# Patient Record
Sex: Female | Born: 1965 | Race: White | Hispanic: No | Smoking: Current every day smoker
Health system: Southern US, Community
[De-identification: ages and names within clinical notes are randomized; demographics above are authoritative.]

## PROBLEM LIST (undated history)

## (undated) DIAGNOSIS — E785 Hyperlipidemia, unspecified: Secondary | ICD-10-CM

## (undated) DIAGNOSIS — F419 Anxiety disorder, unspecified: Secondary | ICD-10-CM

## (undated) DIAGNOSIS — M1990 Unspecified osteoarthritis, unspecified site: Secondary | ICD-10-CM

## (undated) DIAGNOSIS — J439 Emphysema, unspecified: Secondary | ICD-10-CM

## (undated) DIAGNOSIS — C801 Malignant (primary) neoplasm, unspecified: Secondary | ICD-10-CM

## (undated) DIAGNOSIS — F1910 Other psychoactive substance abuse, uncomplicated: Secondary | ICD-10-CM

## (undated) DIAGNOSIS — M797 Fibromyalgia: Secondary | ICD-10-CM

## (undated) DIAGNOSIS — K219 Gastro-esophageal reflux disease without esophagitis: Secondary | ICD-10-CM

## (undated) DIAGNOSIS — D649 Anemia, unspecified: Secondary | ICD-10-CM

## (undated) DIAGNOSIS — I10 Essential (primary) hypertension: Secondary | ICD-10-CM

## (undated) DIAGNOSIS — E079 Disorder of thyroid, unspecified: Secondary | ICD-10-CM

## (undated) DIAGNOSIS — J449 Chronic obstructive pulmonary disease, unspecified: Secondary | ICD-10-CM

## (undated) HISTORY — PX: HAMMER TOE SURGERY: SHX385

## (undated) HISTORY — DX: Disorder of thyroid, unspecified: E07.9

## (undated) HISTORY — DX: Essential (primary) hypertension: I10

## (undated) HISTORY — DX: Unspecified osteoarthritis, unspecified site: M19.90

## (undated) HISTORY — PX: ABDOMINAL HYSTERECTOMY: SHX81

## (undated) HISTORY — PX: SHOULDER SURGERY: SHX246

## (undated) HISTORY — PX: KNEE SURGERY: SHX244

## (undated) HISTORY — PX: APPENDECTOMY: SHX54

## (undated) HISTORY — DX: Other psychoactive substance abuse, uncomplicated: F19.10

## (undated) HISTORY — DX: Hyperlipidemia, unspecified: E78.5

## (undated) HISTORY — PX: CHOLECYSTECTOMY: SHX55

## (undated) HISTORY — DX: Anxiety disorder, unspecified: F41.9

---

## 2009-08-03 ENCOUNTER — Ambulatory Visit: Payer: Self-pay | Admitting: Cardiology

## 2009-08-03 ENCOUNTER — Ambulatory Visit: Payer: Self-pay | Admitting: Cardiovascular Disease

## 2009-08-03 ENCOUNTER — Inpatient Hospital Stay (HOSPITAL_COMMUNITY): Admission: EM | Admit: 2009-08-03 | Discharge: 2009-08-04 | Payer: Self-pay | Admitting: Cardiology

## 2011-02-21 LAB — GLUCOSE, CAPILLARY
Glucose-Capillary: 126 mg/dL — ABNORMAL HIGH (ref 70–99)
Glucose-Capillary: 95 mg/dL (ref 70–99)

## 2011-02-21 LAB — LIPID PANEL
Cholesterol: 185 mg/dL (ref 0–200)
HDL: 59 mg/dL (ref >39)
Total CHOL/HDL Ratio: 3.1 RATIO
Triglycerides: 128 mg/dL (ref <150)

## 2011-02-21 LAB — BASIC METABOLIC PANEL
Calcium: 9 mg/dL (ref 8.4–10.5)
Creatinine, Ser: 0.81 mg/dL (ref 0.4–1.2)
GFR calc Af Amer: >60 mL/min (ref >60)
GFR calc non Af Amer: >60 mL/min (ref >60)
Glucose, Bld: 93 mg/dL (ref 70–99)
Sodium: 128 mEq/L — ABNORMAL LOW (ref 135–145)

## 2011-02-21 LAB — CBC
Hemoglobin: 12.1 g/dL (ref 12.0–15.0)
RDW: 12.9 % (ref 11.5–15.5)

## 2011-07-25 ENCOUNTER — Other Ambulatory Visit: Payer: Self-pay | Admitting: Diagnostic Neuroimaging

## 2011-07-25 DIAGNOSIS — R42 Dizziness and giddiness: Secondary | ICD-10-CM

## 2011-07-25 DIAGNOSIS — R269 Unspecified abnormalities of gait and mobility: Secondary | ICD-10-CM

## 2011-08-04 ENCOUNTER — Other Ambulatory Visit: Payer: Self-pay

## 2011-08-11 ENCOUNTER — Ambulatory Visit
Admission: RE | Admit: 2011-08-11 | Discharge: 2011-08-11 | Disposition: A | Discharge status: Home / Self Care | Payer: Medicaid Other | Source: Ambulatory Visit | Attending: Diagnostic Neuroimaging | Admitting: Diagnostic Neuroimaging

## 2011-08-11 DIAGNOSIS — R42 Dizziness and giddiness: Secondary | ICD-10-CM

## 2011-08-11 DIAGNOSIS — R269 Unspecified abnormalities of gait and mobility: Secondary | ICD-10-CM

## 2014-01-11 ENCOUNTER — Ambulatory Visit: Payer: Self-pay | Admitting: General Practice

## 2014-01-23 ENCOUNTER — Encounter (INDEPENDENT_AMBULATORY_CARE_PROVIDER_SITE_OTHER): Payer: Self-pay

## 2014-01-23 ENCOUNTER — Ambulatory Visit (INDEPENDENT_AMBULATORY_CARE_PROVIDER_SITE_OTHER): Payer: Medicaid Other | Admitting: General Practice

## 2014-01-23 ENCOUNTER — Encounter: Payer: Self-pay | Admitting: General Practice

## 2014-01-23 VITALS — BP 146/91 | HR 69 | Temp 98.2°F | Ht 64.0 in | Wt 182.0 lb

## 2014-01-23 DIAGNOSIS — I10 Essential (primary) hypertension: Secondary | ICD-10-CM

## 2014-01-23 DIAGNOSIS — E039 Hypothyroidism, unspecified: Secondary | ICD-10-CM

## 2014-01-23 DIAGNOSIS — E785 Hyperlipidemia, unspecified: Secondary | ICD-10-CM

## 2014-01-23 MED ORDER — LISINOPRIL 5 MG PO TABS: 5.0000 mg | ORAL_TABLET | Freq: Every day | ORAL | Status: DC

## 2014-01-23 NOTE — Patient Instructions (Addendum)

## 2014-01-23 NOTE — Progress Notes (Signed)
   Subjective:    Patient ID: Kendra Michael, female    DOB: December 31, 1965, 48 y.o.   MRN: [id number]  HPI Patient presents today establish care. She was being seen by [facility], but wants to establish care so acute conditions can be managed. History of HTN, HLD, hypothyroidism. She reports a history of chronic hip/back pain, being managed by [facility]. Anxiety was being managed by [facility] in [place] was managing anxiety up until 4 months ago. Patient stopped going to [facility] due to discharge, not maintaining appointments.     Review of Systems  Constitutional: Negative for fever and chills.  Respiratory: Negative for chest tightness and shortness of breath.   Cardiovascular: Negative for chest pain and palpitations.  Musculoskeletal: Positive for back pain.       Right great toe has surgical pin still in place Chronic back pain        Objective:   Physical Exam  Constitutional: She is oriented to person, place, and time. She appears well-developed and well-nourished.  Cardiovascular: Normal rate, regular rhythm and normal heart sounds.   Pulmonary/Chest: Effort normal and breath sounds normal. No respiratory distress. She exhibits no tenderness.  Abdominal: Soft. Bowel sounds are normal. She exhibits no distension. There is no tenderness.  Musculoskeletal:  Pin noted to right great toe area, negative redness or drainage  Neurological: She is alert and oriented to person, place, and time.  Skin: Skin is warm and dry.  Psychiatric: She has a normal mood and affect.          Assessment & Plan:  1. Hypothyroidism  - Thyroid Panel With TSH  2. Hyperlipidemia  - NMR, lipoprofile  3. Hypertension  - CMP14+EGFR - lisinopril (PRINIVIL,ZESTRIL) 5 MG tablet; Take 1 tablet (5 mg total) by mouth daily.  Dispense: 30 tablet; Refill: 3 -Continue all current medications Labs pending F/u in 3 months Discussed benefits  of regular exercise and healthy eating Patient verbalized understanding Erby Pian, FNP-C

## 2014-01-24 DIAGNOSIS — I10 Essential (primary) hypertension: Secondary | ICD-10-CM | POA: Insufficient documentation

## 2014-01-24 DIAGNOSIS — E039 Hypothyroidism, unspecified: Secondary | ICD-10-CM | POA: Insufficient documentation

## 2014-01-24 DIAGNOSIS — E785 Hyperlipidemia, unspecified: Secondary | ICD-10-CM | POA: Insufficient documentation

## 2014-01-25 LAB — NMR, LIPOPROFILE
CHOLESTEROL: 199 mg/dL (ref <200)
HDL Cholesterol by NMR: 45 mg/dL (ref >=40)
HDL PARTICLE NUMBER: 32.2 umol/L (ref >=30.5)
LDL Particle Number: 1715 nmol/L — ABNORMAL HIGH (ref <1000)
LDL SIZE: 21.7 nm (ref >20.5)
LDLC SERPL CALC-MCNC: 110 mg/dL — ABNORMAL HIGH (ref <100)
LP-IR Score: 64 — ABNORMAL HIGH (ref <=45)
SMALL LDL PARTICLE NUMBER: 637 nmol/L — AB (ref <=527)
TRIGLYCERIDES BY NMR: 222 mg/dL — AB (ref <150)

## 2014-01-25 LAB — CMP14+EGFR
A/G RATIO: 1.8 (ref 1.1–2.5)
ALBUMIN: 4.2 g/dL (ref 3.5–5.5)
ALT: 16 IU/L (ref 0–32)
AST: 20 IU/L (ref 0–40)
Alkaline Phosphatase: 83 IU/L (ref 39–117)
BILIRUBIN TOTAL: 0.2 mg/dL (ref 0.0–1.2)
BUN/Creatinine Ratio: 7 — ABNORMAL LOW (ref 9–23)
BUN: 5 mg/dL — AB (ref 6–24)
CO2: 25 mmol/L (ref 18–29)
Calcium: 9.5 mg/dL (ref 8.7–10.2)
Chloride: 99 mmol/L (ref 97–108)
Creatinine, Ser: 0.72 mg/dL (ref 0.57–1.00)
GFR, EST AFRICAN AMERICAN: 114 mL/min/{1.73_m2} (ref >59)
GFR, EST NON AFRICAN AMERICAN: 99 mL/min/{1.73_m2} (ref >59)
GLOBULIN, TOTAL: 2.3 g/dL (ref 1.5–4.5)
GLUCOSE: 85 mg/dL (ref 65–99)
Potassium: 4.9 mmol/L (ref 3.5–5.2)
Sodium: 141 mmol/L (ref 134–144)
TOTAL PROTEIN: 6.5 g/dL (ref 6.0–8.5)

## 2014-01-25 LAB — THYROID PANEL WITH TSH
Free Thyroxine Index: 2.1 (ref 1.2–4.9)
T3 UPTAKE RATIO: 26 % (ref 24–39)
T4, Total: 8 ug/dL (ref 4.5–12.0)
TSH: 1.82 u[IU]/mL (ref 0.450–4.500)

## 2014-02-03 ENCOUNTER — Other Ambulatory Visit: Payer: Self-pay | Admitting: General Practice

## 2014-02-06 ENCOUNTER — Telehealth: Payer: Self-pay | Admitting: *Deleted

## 2014-02-06 NOTE — Telephone Encounter (Signed)
Aware of results. 

## 2014-02-06 NOTE — Telephone Encounter (Signed)
Message copied by Shelbie Ammons on Mon Feb 06, 2014 12:15 PM ------      Message from: Erby Pian      Created: Fri Feb 03, 2014  6:35 PM       Please inform patient that most labs are wnl. LDL elevated, are you taking pravachol as prescribed. Please work on eating a healthy diet (low fat, baked foods) and some form of regular exercise. Will recheck labs in 3 months. ------

## 2014-02-16 ENCOUNTER — Other Ambulatory Visit: Payer: Self-pay | Admitting: Family Medicine

## 2014-03-06 ENCOUNTER — Other Ambulatory Visit: Payer: Self-pay | Admitting: General Practice

## 2014-03-08 NOTE — Telephone Encounter (Signed)
Last ov 01/23/14. Last refill 10/03/13. Call to [facility] if approved [phone].

## 2014-03-09 ENCOUNTER — Telehealth: Payer: Self-pay | Admitting: *Deleted

## 2014-03-09 ENCOUNTER — Other Ambulatory Visit: Payer: Self-pay | Admitting: General Practice

## 2014-03-09 DIAGNOSIS — F411 Generalized anxiety disorder: Secondary | ICD-10-CM

## 2014-03-09 MED ORDER — CLONAZEPAM 1 MG PO TABS: 1.0000 mg | ORAL_TABLET | Freq: Every day | ORAL | Status: DC | PRN

## 2014-03-09 NOTE — Telephone Encounter (Signed)
Aware, rx for 30 pills of Klonipin 1mg  called to [facility].

## 2014-04-12 ENCOUNTER — Telehealth: Payer: Self-pay | Admitting: General Practice

## 2014-04-17 ENCOUNTER — Telehealth: Payer: Self-pay | Admitting: Family Medicine

## 2014-04-17 ENCOUNTER — Ambulatory Visit: Payer: Medicaid Other | Admitting: Family Medicine

## 2014-04-17 NOTE — Telephone Encounter (Signed)
appt given for tomorrow with bill 

## 2014-04-18 ENCOUNTER — Ambulatory Visit (INDEPENDENT_AMBULATORY_CARE_PROVIDER_SITE_OTHER): Payer: Medicaid Other | Admitting: Family Medicine

## 2014-04-18 ENCOUNTER — Ambulatory Visit (INDEPENDENT_AMBULATORY_CARE_PROVIDER_SITE_OTHER): Payer: Medicaid Other

## 2014-04-18 ENCOUNTER — Encounter: Payer: Self-pay | Admitting: Family Medicine

## 2014-04-18 VITALS — BP 136/81 | HR 93 | Temp 97.8°F | Ht 64.0 in | Wt 188.2 lb

## 2014-04-18 DIAGNOSIS — F17200 Nicotine dependence, unspecified, uncomplicated: Secondary | ICD-10-CM

## 2014-04-18 DIAGNOSIS — Z720 Tobacco use: Secondary | ICD-10-CM

## 2014-04-18 DIAGNOSIS — J439 Emphysema, unspecified: Secondary | ICD-10-CM

## 2014-04-18 DIAGNOSIS — R0602 Shortness of breath: Secondary | ICD-10-CM

## 2014-04-18 DIAGNOSIS — J438 Other emphysema: Secondary | ICD-10-CM

## 2014-04-18 MED ORDER — VARENICLINE TARTRATE 1 MG PO TABS: 1.0000 mg | ORAL_TABLET | Freq: Two times a day (BID) | ORAL | Status: DC

## 2014-04-18 MED ORDER — VARENICLINE TARTRATE 0.5 MG X 11 & 1 MG X 42 PO MISC: ORAL | Status: DC

## 2014-04-18 MED ORDER — BUDESONIDE-FORMOTEROL FUMARATE 160-4.5 MCG/ACT IN AERO: 2.0000 | INHALATION_SPRAY | Freq: Two times a day (BID) | RESPIRATORY_TRACT | Status: DC

## 2014-04-18 NOTE — Progress Notes (Signed)
   Subjective:    Patient ID: Kendra Michael, female    DOB: [birth year], 48 y.o.   MRN: [id number]  HPI This 48 y.o. female presents for evaluation of hospital follow up.  She was recently seen for copd exacerbation. She is feeling a lot better since her hospitalization for copd exacerbation.  She wants to quit smoking.   Review of Systems C/o sob No chest pain,  HA, dizziness, vision change, N/V, diarrhea, constipation, dysuria, urinary urgency or frequency, myalgias, arthralgias or rash.     Objective:   Physical Exam Vital signs noted  Well developed well nourished female.  HEENT - Head atraumatic Normocephalic                Eyes - PERRLA, Conjuctiva - clear Sclera- Clear EOMI                Ears - EAC's Wnl TM's Wnl Gross Hearing WNL                Nose - Nares patent                 Throat - oropharanx wnl Respiratory - Lungs CTA bilateral Cardiac - RRR S1 and S2 without murmur GI - Abdomen soft Nontender and bowel sounds active x 4 Extremities - No edema. Neuro - Grossly intact.  CXR - no infiltrates Prelimnary reading by Iverson Alamin     Assessment & Plan:  COPD (chronic obstructive pulmonary disease) with emphysema - Plan: budesonide-formoterol (SYMBICORT) 160-4.5 MCG/ACT inhaler, DG Chest 2 View  Tobacco abuse - Plan: varenicline (CHANTIX STARTING MONTH PAK) 0.5 MG X 11 & 1 MG X 42 tablet, varenicline (CHANTIX CONTINUING MONTH PAK) 1 MG tablet, DG Chest 2 View  SOB (shortness of breath) - Plan: DG Chest 2 View  Push po fluids, rest, tylenol and motrin otc prn as directed for fever, arthralgias, and myalgias.  Follow up prn if sx's continue or persist.  Lysbeth Penner FNP

## 2014-05-13 ENCOUNTER — Other Ambulatory Visit: Payer: Self-pay | Admitting: General Practice

## 2014-08-01 ENCOUNTER — Other Ambulatory Visit: Payer: Self-pay | Admitting: *Deleted

## 2014-08-01 MED ORDER — LEVOTHYROXINE SODIUM 25 MCG PO TABS: ORAL_TABLET | ORAL | Status: DC

## 2014-08-08 ENCOUNTER — Encounter: Payer: Self-pay | Admitting: Family Medicine

## 2014-08-08 ENCOUNTER — Ambulatory Visit (INDEPENDENT_AMBULATORY_CARE_PROVIDER_SITE_OTHER): Payer: Medicaid Other | Admitting: Family Medicine

## 2014-08-08 VITALS — BP 136/83 | HR 69 | Temp 98.3°F | Ht 64.0 in | Wt 182.0 lb

## 2014-08-08 DIAGNOSIS — I10 Essential (primary) hypertension: Secondary | ICD-10-CM

## 2014-08-08 DIAGNOSIS — F411 Generalized anxiety disorder: Secondary | ICD-10-CM

## 2014-08-08 DIAGNOSIS — E785 Hyperlipidemia, unspecified: Secondary | ICD-10-CM

## 2014-08-08 LAB — POCT CBC
Granulocyte percent: 58.1 %G (ref 37–80)
HCT, POC: 43.9 % (ref 37.7–47.9)
Hemoglobin: 14.5 g/dL (ref 12.2–16.2)
Lymph, poc: 3.8 — AB (ref 0.6–3.4)
MCH, POC: 30.5 pg (ref 27–31.2)
MCHC: 33 g/dL (ref 31.8–35.4)
MCV: 92.5 fL (ref 80–97)
MPV: 8.6 fL (ref 0–99.8)
POC Granulocyte: 5.4 (ref 2–6.9)
POC LYMPH PERCENT: 40.8 %L (ref 10–50)
Platelet Count, POC: 223 10*3/uL (ref 142–424)
RBC: 4.7 M/uL (ref 4.04–5.48)
RDW, POC: 12.3 %
WBC: 9.3 10*3/uL (ref 4.6–10.2)

## 2014-08-08 MED ORDER — PRAVASTATIN SODIUM 40 MG PO TABS: ORAL_TABLET | ORAL | Status: DC

## 2014-08-08 MED ORDER — LEVOTHYROXINE SODIUM 25 MCG PO TABS: ORAL_TABLET | ORAL | Status: DC

## 2014-08-08 MED ORDER — HYDROXYZINE HCL 25 MG PO TABS: 25.0000 mg | ORAL_TABLET | Freq: Three times a day (TID) | ORAL | Status: DC | PRN

## 2014-08-08 MED ORDER — BUPROPION HCL ER (XL) 150 MG PO TB24: 150.0000 mg | ORAL_TABLET | Freq: Every day | ORAL | Status: DC

## 2014-08-08 MED ORDER — LISINOPRIL 5 MG PO TABS: ORAL_TABLET | ORAL | Status: DC

## 2014-08-08 NOTE — Progress Notes (Signed)
   Subjective:    Patient ID: Kendra Michael, female    DOB: Aug 30, 1966, 48 y.o.   MRN: [id number]  HPI  This 48 y.o. female presents for evaluation of follow up on hypertension, hyperlipidemia, and hypothyroidism.  She has hx of tobacco abuse and is trying to quit.  She could not tolerate chantix and stopped taking it.  She is having anxiety and plans on seeing a psychiatrist through [facility].  She is wanting something for anxiety.  Review of Systems C/o anxiety   No chest pain, SOB, HA, dizziness, vision change, N/V, diarrhea, constipation, dysuria, urinary urgency or frequency, myalgias, arthralgias or rash.  Objective:   Physical Exam Vital signs noted  Well developed well nourished female.  HEENT - Head atraumatic Normocephalic                Eyes - PERRLA, Conjuctiva - clear Sclera- Clear EOMI                Ears - EAC's Wnl TM's Wnl Gross Hearing WNL                Nose - Nares patent                 Throat - oropharanx wnl Respiratory - Lungs CTA bilateral Cardiac - RRR S1 and S2 without murmur GI - Abdomen soft Nontender and bowel sounds active x 4 Extremities - No edema. Neuro - Grossly intact.       Assessment & Plan:  Hyperlipemia - Plan: CMP14+EGFR, Lipid panel, pravastatin (PRAVACHOL) 40 MG tablet  Essential hypertension, benign - Plan: POCT CBC, CMP14+EGFR, lisinopril (PRINIVIL,ZESTRIL) 5 MG tablet, levothyroxine (SYNTHROID, LEVOTHROID) 25 MCG tablet  Generalized anxiety disorder - Plan: buPROPion (WELLBUTRIN XL) 150 MG 24 hr tablet, hydrOXYzine (ATARAX/VISTARIL) 25 MG tablet  Tobacco abuse - Wellbutrin, and discussed using nicotine patches.    Lysbeth Penner FNP

## 2014-08-09 ENCOUNTER — Telehealth: Payer: Self-pay | Admitting: Family Medicine

## 2014-08-09 LAB — LIPID PANEL
Chol/HDL Ratio: 5.1 ratio units — ABNORMAL HIGH (ref 0.0–4.4)
Cholesterol, Total: 185 mg/dL (ref 100–199)
HDL: 36 mg/dL — ABNORMAL LOW (ref >39)
LDL Calculated: 87 mg/dL (ref 0–99)
Triglycerides: 308 mg/dL — ABNORMAL HIGH (ref 0–149)
VLDL Cholesterol Cal: 62 mg/dL — ABNORMAL HIGH (ref 5–40)

## 2014-08-09 LAB — CMP14+EGFR
ALT: 20 IU/L (ref 0–32)
AST: 21 IU/L (ref 0–40)
Albumin/Globulin Ratio: 2 (ref 1.1–2.5)
Albumin: 4.3 g/dL (ref 3.5–5.5)
Alkaline Phosphatase: 88 IU/L (ref 39–117)
BUN/Creatinine Ratio: 9 (ref 9–23)
BUN: 8 mg/dL (ref 6–24)
CO2: 25 mmol/L (ref 18–29)
Calcium: 9.6 mg/dL (ref 8.7–10.2)
Chloride: 100 mmol/L (ref 97–108)
Creatinine, Ser: 0.88 mg/dL (ref 0.57–1.00)
GFR calc Af Amer: 89 mL/min/{1.73_m2} (ref >59)
GFR calc non Af Amer: 77 mL/min/{1.73_m2} (ref >59)
Globulin, Total: 2.2 g/dL (ref 1.5–4.5)
Glucose: 106 mg/dL — ABNORMAL HIGH (ref 65–99)
Potassium: 4.4 mmol/L (ref 3.5–5.2)
Sodium: 140 mmol/L (ref 134–144)
Total Bilirubin: <0.2 mg/dL (ref 0.0–1.2)
Total Protein: 6.5 g/dL (ref 6.0–8.5)

## 2014-08-10 ENCOUNTER — Other Ambulatory Visit: Payer: Self-pay | Admitting: Family Medicine

## 2014-08-10 DIAGNOSIS — F411 Generalized anxiety disorder: Secondary | ICD-10-CM

## 2014-08-10 MED ORDER — HYDROXYZINE HCL 25 MG PO TABS: 25.0000 mg | ORAL_TABLET | Freq: Three times a day (TID) | ORAL | Status: DC | PRN

## 2014-08-10 NOTE — Telephone Encounter (Signed)
I sent her #90 w/ 3 rf of atarax and this is all I feel comfortalbe in rx her at this time.  Follow up with [facility]

## 2014-08-10 NOTE — Telephone Encounter (Signed)
Number busy 9/24

## 2014-08-10 NOTE — Telephone Encounter (Signed)
Please send in for number 90

## 2014-08-11 ENCOUNTER — Encounter: Payer: Self-pay | Admitting: *Deleted

## 2014-08-11 ENCOUNTER — Telehealth: Payer: Self-pay | Admitting: *Deleted

## 2014-08-11 NOTE — Telephone Encounter (Signed)
Message copied by Shelbie Ammons on Fri Aug 11, 2014 12:12 PM ------      Message from: Lysbeth Penner      Created: Fri Aug 11, 2014  9:00 AM       Katherene Ponto are elevated and would take fish oil tablets otc ------

## 2014-08-11 NOTE — Telephone Encounter (Signed)
Aware,script was done. He will rrelay message to her.

## 2014-10-19 ENCOUNTER — Other Ambulatory Visit: Payer: Self-pay | Admitting: Family Medicine

## 2014-11-29 ENCOUNTER — Other Ambulatory Visit: Payer: Self-pay | Admitting: Family Medicine

## 2014-12-17 ENCOUNTER — Other Ambulatory Visit: Payer: Self-pay | Admitting: Family Medicine

## 2014-12-21 ENCOUNTER — Ambulatory Visit (INDEPENDENT_AMBULATORY_CARE_PROVIDER_SITE_OTHER): Payer: Medicaid Other | Admitting: Family Medicine

## 2014-12-21 VITALS — BP 164/92 | HR 69 | Temp 98.0°F | Ht 64.0 in | Wt 176.4 lb

## 2014-12-21 DIAGNOSIS — M79674 Pain in right toe(s): Secondary | ICD-10-CM

## 2014-12-21 DIAGNOSIS — F411 Generalized anxiety disorder: Secondary | ICD-10-CM

## 2014-12-21 DIAGNOSIS — E785 Hyperlipidemia, unspecified: Secondary | ICD-10-CM

## 2014-12-21 MED ORDER — CLONAZEPAM 1 MG PO TABS: 1.0000 mg | ORAL_TABLET | Freq: Every day | ORAL | Status: DC | PRN

## 2014-12-21 NOTE — Addendum Note (Signed)
Addended by: Earlene Plater on: 12/21/2014 12:26 PM   Modules accepted: Miquel Dunn

## 2014-12-21 NOTE — Progress Notes (Signed)
   Subjective:    Patient ID: Kendra Michael, female    DOB: 1966-02-28, 49 y.o.   MRN: [id number]  HPI Patient is here with c/o right toe pain.  She has hx of halux valgus and she had this repaired by Dr. Irving Shows podiatrist and he doesn't take medicaid. She needs an ortho referral.  Review of Systems  Constitutional: Negative for fever.  HENT: Negative for ear pain.   Eyes: Negative for discharge.  Respiratory: Negative for cough.   Cardiovascular: Negative for chest pain.  Gastrointestinal: Negative for abdominal distention.  Endocrine: Negative for polyuria.  Genitourinary: Negative for difficulty urinating.  Musculoskeletal: Negative for gait problem and neck pain.  Skin: Negative for color change and rash.  Neurological: Negative for speech difficulty and headaches.  Psychiatric/Behavioral: Negative for agitation.       Objective:    BP 164/92 mmHg  Pulse 69  Temp(Src) 98 F (36.7 C) (Oral)  Ht 5\' 4"  (1.626 m)  Wt 176 lb 6.4 oz (80.015 kg)  BMI 30.26 kg/m2 Physical Exam  Constitutional: She is oriented to person, place, and time. She appears well-developed and well-nourished.  HENT:  Head: Normocephalic and atraumatic.  Mouth/Throat: Oropharynx is clear and moist.  Eyes: Pupils are equal, round, and reactive to light.  Neck: Normal range of motion. Neck supple.  Cardiovascular: Normal rate and regular rhythm.   No murmur heard. Pulmonary/Chest: Effort normal and breath sounds normal.  Abdominal: Soft. Bowel sounds are normal. There is no tenderness.  Neurological: She is alert and oriented to person, place, and time.  Skin: Skin is warm and dry.  Psychiatric: She has a normal mood and affect.          Assessment & Plan:     ICD-9-CM ICD-10-CM   1. Generalized anxiety disorder 300.02 F41.1 Ambulatory referral to Orthopedic Surgery     clonazePAM (KLONOPIN) 1 MG tablet  2. Hyperlipidemia 272.4 E78.5 Ambulatory referral to Orthopedic Surgery     Lipid panel    3. Toe pain, right 729.5 M79.674 Ambulatory referral to Orthopedic Surgery     Ambulatory referral to Orthopedic Surgery     No Follow-up on file.  Lysbeth Penner FNP

## 2014-12-22 ENCOUNTER — Other Ambulatory Visit: Payer: Self-pay | Admitting: Family Medicine

## 2014-12-22 LAB — LIPID PANEL
Chol/HDL Ratio: 6.9 ratio units — ABNORMAL HIGH (ref 0.0–4.4)
Cholesterol, Total: 227 mg/dL — ABNORMAL HIGH (ref 100–199)
HDL: 33 mg/dL — ABNORMAL LOW (ref >39)
LDL Calculated: 125 mg/dL — ABNORMAL HIGH (ref 0–99)
Triglycerides: 344 mg/dL — ABNORMAL HIGH (ref 0–149)
VLDL Cholesterol Cal: 69 mg/dL — ABNORMAL HIGH (ref 5–40)

## 2014-12-22 MED ORDER — PRAVASTATIN SODIUM 80 MG PO TABS: 80.0000 mg | ORAL_TABLET | Freq: Every day | ORAL | Status: DC

## 2015-01-27 ENCOUNTER — Other Ambulatory Visit: Payer: Self-pay | Admitting: Family Medicine

## 2015-02-12 ENCOUNTER — Other Ambulatory Visit: Payer: Self-pay | Admitting: Family Medicine

## 2015-02-17 ENCOUNTER — Emergency Department (HOSPITAL_COMMUNITY): Payer: Medicaid Other

## 2015-02-17 ENCOUNTER — Encounter (HOSPITAL_COMMUNITY): Payer: Self-pay | Admitting: Emergency Medicine

## 2015-02-17 ENCOUNTER — Emergency Department (HOSPITAL_COMMUNITY)
Admission: EM | Admit: 2015-02-17 | Discharge: 2015-02-17 | Disposition: A | Discharge status: Home / Self Care | Payer: Medicaid Other | Attending: Emergency Medicine | Admitting: Emergency Medicine

## 2015-02-17 DIAGNOSIS — W228XXA Striking against or struck by other objects, initial encounter: Secondary | ICD-10-CM | POA: Insufficient documentation

## 2015-02-17 DIAGNOSIS — Y9301 Activity, walking, marching and hiking: Secondary | ICD-10-CM | POA: Diagnosis not present

## 2015-02-17 DIAGNOSIS — S99922A Unspecified injury of left foot, initial encounter: Secondary | ICD-10-CM | POA: Diagnosis present

## 2015-02-17 DIAGNOSIS — E079 Disorder of thyroid, unspecified: Secondary | ICD-10-CM | POA: Diagnosis not present

## 2015-02-17 DIAGNOSIS — Z79899 Other long term (current) drug therapy: Secondary | ICD-10-CM | POA: Insufficient documentation

## 2015-02-17 DIAGNOSIS — I10 Essential (primary) hypertension: Secondary | ICD-10-CM | POA: Diagnosis not present

## 2015-02-17 DIAGNOSIS — Z720 Tobacco use: Secondary | ICD-10-CM | POA: Diagnosis not present

## 2015-02-17 DIAGNOSIS — E785 Hyperlipidemia, unspecified: Secondary | ICD-10-CM | POA: Diagnosis not present

## 2015-02-17 DIAGNOSIS — F419 Anxiety disorder, unspecified: Secondary | ICD-10-CM | POA: Diagnosis not present

## 2015-02-17 DIAGNOSIS — Z791 Long term (current) use of non-steroidal anti-inflammatories (NSAID): Secondary | ICD-10-CM | POA: Diagnosis not present

## 2015-02-17 DIAGNOSIS — Y998 Other external cause status: Secondary | ICD-10-CM | POA: Diagnosis not present

## 2015-02-17 DIAGNOSIS — S90122A Contusion of left lesser toe(s) without damage to nail, initial encounter: Secondary | ICD-10-CM

## 2015-02-17 DIAGNOSIS — M1990 Unspecified osteoarthritis, unspecified site: Secondary | ICD-10-CM | POA: Diagnosis not present

## 2015-02-17 DIAGNOSIS — Z7982 Long term (current) use of aspirin: Secondary | ICD-10-CM | POA: Insufficient documentation

## 2015-02-17 DIAGNOSIS — Y9289 Other specified places as the place of occurrence of the external cause: Secondary | ICD-10-CM | POA: Insufficient documentation

## 2015-02-17 DIAGNOSIS — S90112A Contusion of left great toe without damage to nail, initial encounter: Secondary | ICD-10-CM | POA: Insufficient documentation

## 2015-02-17 MED ORDER — HYDROCODONE-ACETAMINOPHEN 5-325 MG PO TABS
2.0000 | ORAL_TABLET | Freq: Once | ORAL | Status: AC
  Administered 2015-02-17: 2 via ORAL
  Filled 2015-02-17: qty 2

## 2015-02-17 MED ORDER — HYDROCODONE-ACETAMINOPHEN 5-325 MG PO TABS: 1.0000 | ORAL_TABLET | ORAL | Status: DC | PRN

## 2015-02-17 MED ORDER — KETOROLAC TROMETHAMINE 10 MG PO TABS
10.0000 mg | ORAL_TABLET | Freq: Once | ORAL | Status: AC
  Administered 2015-02-17: 10 mg via ORAL
  Filled 2015-02-17: qty 1

## 2015-02-17 MED ORDER — IBUPROFEN 800 MG PO TABS: 800.0000 mg | ORAL_TABLET | Freq: Three times a day (TID) | ORAL | Status: DC

## 2015-02-17 NOTE — ED Notes (Signed)
Patient with no complaints at this time. Respirations even and unlabored. Skin warm/dry. Discharge instructions reviewed with patient at this time. Patient given opportunity to voice concerns/ask questions. Patient discharged at this time and left Emergency Department with steady gait.   

## 2015-02-17 NOTE — Discharge Instructions (Signed)
Your examination is negative for any neurologic or vascular problem. The x-ray of your toe is negative for fracture or dislocation. Please use the buddy tape and the postoperative shoe for the next 3 or 4 days. Please use ibuprofen 3 times daily with food. May use Norco for pain if needed. This medication may cause drowsiness, please do not drive, free machinery, drink alcohol, or participated activity requiring concentration while taking this medication. Please see Dr. Laurance Flatten for additional evaluation and management if not improving.

## 2015-02-17 NOTE — ED Notes (Signed)
Patient reports was walking today and hit left great toe on sidewalk. States has had operation on same toe before. Complaining of increasing pain throughout the evening.

## 2015-02-17 NOTE — ED Provider Notes (Signed)
CSN: [id number]     Arrival date & time 02/17/15  1935 History   First MD Initiated Contact with Patient 02/17/15 2122     Chief Complaint  Patient presents with  . Toe Injury     (Consider location/radiation/quality/duration/timing/severity/associated sxs/prior Treatment) HPI Comments: Patient is a 49 year old female presents to the emergency department with a complaint of left first toe pain.  Patient states that earlier this morning she accidentally hit her first toe against the sidewalk near her home. She states that it hurt at the time of the injury, but as the day has gone by it has been hurting more and more and actually throbbing. She is concerned because she has had an operation on that toe in the past, and is concerned about new injury. She denies being on any anticoagulation medications.  The history is provided by the patient.    Past Medical History  Diagnosis Date  . Hypertension   . Hyperlipidemia   . Thyroid disease   . Substance abuse   . Arthritis   . Anxiety    Past Surgical History  Procedure Laterality Date  . Appendectomy    . Cholecystectomy    . Knee surgery Bilateral   . Shoulder surgery Right   . Hammer toe surgery Bilateral   . Abdominal hysterectomy     History reviewed. No pertinent family history. History  Substance Use Topics  . Smoking status: Current Every Day Smoker -- 1.00 packs/day    Types: Cigarettes  . Smokeless tobacco: Not on file  . Alcohol Use: No   OB History    No data available     Review of Systems  Musculoskeletal: Positive for arthralgias.  Psychiatric/Behavioral: The patient is nervous/anxious.   All other systems reviewed and are negative.     Allergies  Review of patient's allergies indicates no known allergies.  Home Medications   Prior to Admission medications   Medication Sig Start Date End Date Taking? Authorizing Provider  aspirin 81 MG tablet Take 81 mg by mouth daily.    Historical Provider, MD    clonazePAM (KLONOPIN) 1 MG tablet Take 1 tablet (1 mg total) by mouth daily as needed for anxiety. 12/21/14   Lysbeth Penner, FNP  fentaNYL (DURAGESIC - DOSED MCG/HR) 50 MCG/HR Place 50 mcg onto the skin every 3 (three) days.    Historical Provider, MD  levothyroxine (SYNTHROID, LEVOTHROID) 25 MCG tablet TAKE 1 TABLET BY MOUTH EVERY DAY 08/08/14   Lysbeth Penner, FNP  levothyroxine (SYNTHROID, LEVOTHROID) 25 MCG tablet TAKE 1 TABLET BY MOUTH EVERY DAY 01/29/15   Chipper Herb, MD  lisinopril (PRINIVIL,ZESTRIL) 5 MG tablet TAKE 1 TABLET BY MOUTH EVERY DAY 02/13/15   Chipper Herb, MD  meloxicam (MOBIC) 15 MG tablet Take 15 mg by mouth daily.    Historical Provider, MD  Omega-3 Fatty Acids (FISH OIL) 1000 MG CAPS Take 2 capsules by mouth daily.     Historical Provider, MD  oxyCODONE-acetaminophen (PERCOCET) 10-325 MG per tablet Take 1 tablet by mouth 3 (three) times daily.    Historical Provider, MD  pravastatin (PRAVACHOL) 80 MG tablet Take 1 tablet (80 mg total) by mouth daily. 12/22/14   Lysbeth Penner, FNP  SYMBICORT 160-4.5 MCG/ACT inhaler INHALE 2 PUFFS INTO THE LUNGS 2 TIMES DAILY. 10/20/14   Lysbeth Penner, FNP   BP 137/63 mmHg  Pulse 58  Temp(Src) 97.8 F (36.6 C) (Oral)  Resp 17  Ht 5\' 4"  (1.626 m)  Wt 175 lb (79.379 kg)  BMI 30.02 kg/m2  SpO2 98% Physical Exam  Constitutional: She is oriented to person, place, and time. She appears well-developed and well-nourished.  Non-toxic appearance.  HENT:  Head: Normocephalic.  Right Ear: Tympanic membrane and external ear normal.  Left Ear: Tympanic membrane and external ear normal.  Eyes: EOM and lids are normal. Pupils are equal, round, and reactive to light.  Neck: Normal range of motion. Neck supple. Carotid bruit is not present.  Cardiovascular: Normal rate, regular rhythm, normal heart sounds, intact distal pulses and normal pulses.   Pulmonary/Chest: Breath sounds normal. No respiratory distress.  Abdominal: Soft. Bowel  sounds are normal. There is no tenderness. There is no guarding.  Musculoskeletal: Normal range of motion.       Feet:  Pain to palpation and movement of the distal first toe of the left foot. No other injury appreciated of the left foot. The dorsalis pedis pulse is 2+, the posterior tibial pulses 2+. The Achilles tendon is intact. Capillary refill is less than 2 seconds.  Lymphadenopathy:       Head (right side): No submandibular adenopathy present.       Head (left side): No submandibular adenopathy present.    She has no cervical adenopathy.  Neurological: She is alert and oriented to person, place, and time. She has normal strength. No cranial nerve deficit or sensory deficit.  Skin: Skin is warm and dry.  Psychiatric: She has a normal mood and affect. Her speech is normal.  Nursing note and vitals reviewed.   ED Course  Procedures (including critical care time) Labs Review Labs Reviewed - No data to display  Imaging Review No results found.   EKG Interpretation None      MDM  Vital signs are within normal limits. Pulse oximetry is 98% on room air.   X-ray of the left first toe is negative for fracture or dislocation. The patient is fitted with a postoperative shoe, and the first and second toes were buddy taped. The patient is to follow-up with Dr. Gayland Curry for additional evaluation and management if not improving.    Final diagnoses:  None    **I have reviewed nursing notes, vital signs, and all appropriate lab and imaging results for this patient.Lily Kocher, PA-C 02/17/15 [place], MD 02/18/15 [phone]

## 2015-02-18 ENCOUNTER — Telehealth: Payer: Self-pay | Admitting: Surgery

## 2015-02-18 NOTE — Telephone Encounter (Signed)
ED CM received call from [place] [facility] in [place] concerning prescription clarification. [facility] called to inform patient has just received 30 day supply of Percocet 10/325 on March 15 th, so they will not honor prescription for oxycodone/ acetaminophen 5/325mg  she received in the ED.  No further CM needs identified.

## 2015-02-23 ENCOUNTER — Telehealth: Payer: Self-pay | Admitting: Family Medicine

## 2015-02-25 ENCOUNTER — Other Ambulatory Visit: Payer: Self-pay | Admitting: Family Medicine

## 2015-02-27 ENCOUNTER — Telehealth: Payer: Self-pay | Admitting: Family Medicine

## 2015-02-27 NOTE — Telephone Encounter (Signed)
[facility] has appt 03/09/15

## 2015-02-27 NOTE — Telephone Encounter (Signed)
Has appt 03/09/15, has enough thyroid med until then

## 2015-03-09 ENCOUNTER — Encounter: Payer: Self-pay | Admitting: Family

## 2015-03-09 ENCOUNTER — Ambulatory Visit (INDEPENDENT_AMBULATORY_CARE_PROVIDER_SITE_OTHER): Payer: Medicaid Other | Admitting: Family

## 2015-03-09 VITALS — BP 131/72 | HR 68 | Temp 97.9°F | Ht 64.0 in | Wt 171.2 lb

## 2015-03-09 DIAGNOSIS — J441 Chronic obstructive pulmonary disease with (acute) exacerbation: Secondary | ICD-10-CM | POA: Diagnosis not present

## 2015-03-09 DIAGNOSIS — F411 Generalized anxiety disorder: Secondary | ICD-10-CM

## 2015-03-09 DIAGNOSIS — I10 Essential (primary) hypertension: Secondary | ICD-10-CM

## 2015-03-09 DIAGNOSIS — Z1321 Encounter for screening for nutritional disorder: Secondary | ICD-10-CM

## 2015-03-09 DIAGNOSIS — M5136 Other intervertebral disc degeneration, lumbar region: Secondary | ICD-10-CM | POA: Insufficient documentation

## 2015-03-09 DIAGNOSIS — M51369 Other intervertebral disc degeneration, lumbar region without mention of lumbar back pain or lower extremity pain: Secondary | ICD-10-CM

## 2015-03-09 DIAGNOSIS — E785 Hyperlipidemia, unspecified: Secondary | ICD-10-CM | POA: Diagnosis not present

## 2015-03-09 DIAGNOSIS — E039 Hypothyroidism, unspecified: Secondary | ICD-10-CM | POA: Diagnosis not present

## 2015-03-09 DIAGNOSIS — Z1211 Encounter for screening for malignant neoplasm of colon: Secondary | ICD-10-CM

## 2015-03-09 MED ORDER — LEVOTHYROXINE SODIUM 25 MCG PO TABS: 25.0000 ug | ORAL_TABLET | Freq: Every day | ORAL | Status: DC

## 2015-03-09 NOTE — Patient Instructions (Signed)
Hypothyroidism The thyroid is a large gland located in the lower front of your neck. The thyroid gland helps control metabolism. Metabolism is how your body handles food. It controls metabolism with the hormone thyroxine. When this gland is underactive (hypothyroid), it produces too little hormone.  CAUSES These include:   Absence or destruction of thyroid tissue.  Goiter due to iodine deficiency.  Goiter due to medications.  Congenital defects (since birth).  Problems with the pituitary. This causes a lack of TSH (thyroid stimulating hormone). This hormone tells the thyroid to turn out more hormone. SYMPTOMS  Lethargy (feeling as though you have no energy)  Cold intolerance  Weight gain (in spite of normal food intake)  Dry skin  Coarse hair  Menstrual irregularity (if severe, may lead to infertility)  Slowing of thought processes Cardiac problems are also caused by insufficient amounts of thyroid hormone. Hypothyroidism in the newborn is cretinism, and is an extreme form. It is important that this form be treated adequately and immediately or it will lead rapidly to retarded physical and mental development. DIAGNOSIS  To prove hypothyroidism, your caregiver may do blood tests and ultrasound tests. Sometimes the signs are hidden. It may be necessary for your caregiver to watch this illness with blood tests either before or after diagnosis and treatment. TREATMENT  Low levels of thyroid hormone are increased by using synthetic thyroid hormone. This is a safe, effective treatment. It usually takes about four weeks to gain the full effects of the medication. After you have the full effect of the medication, it will generally take another four weeks for problems to leave. Your caregiver may start you on low doses. If you have had heart problems the dose may be gradually increased. It is generally not an emergency to get rapidly to normal. HOME CARE INSTRUCTIONS   Take your  medications as your caregiver suggests. Let your caregiver know of any medications you are taking or start taking. Your caregiver will help you with dosage schedules.  As your condition improves, your dosage needs may increase. It will be necessary to have continuing blood tests as suggested by your caregiver.  Report all suspected medication side effects to your caregiver. SEEK MEDICAL CARE IF: Seek medical care if you develop:  Sweating.  Tremulousness (tremors).  Anxiety.  Rapid weight loss.  Heat intolerance.  Emotional swings.  Diarrhea.  Weakness. SEEK IMMEDIATE MEDICAL CARE IF:  You develop chest pain, an irregular heart beat (palpitations), or a rapid heart beat. MAKE SURE YOU:   Understand these instructions.  Will watch your condition.  Will get help right away if you are not doing well or get worse. Document Released: 11/03/2005 Document Revised: 01/26/2012 Document Reviewed: 06/23/2008 Exit[employer] Patient Information 2015 [employer]. This information is not intended to replace advice given to you by your health care provider. Make sure you discuss any questions you have with your health care provider. Health Maintenance Adopting a healthy lifestyle and getting preventive care can go a long way to promote health and wellness. Talk with your health care provider about what schedule of regular examinations is right for you. This is a good chance for you to check in with your provider about disease prevention and staying healthy. In between checkups, there are plenty of things you can do on your own. Experts have done a lot of research about which lifestyle changes and preventive measures are most likely to keep you healthy. Ask your health care provider for more information. WEIGHT AND   DIET  Eat a healthy diet  Be sure to include plenty of vegetables, fruits, low-fat dairy products, and lean protein.  Do not eat a lot of foods high in solid fats, added sugars, or  salt.  Get regular exercise. This is one of the most important things you can do for your health.  Most adults should exercise for at least 150 minutes each week. The exercise should increase your heart rate and make you sweat (moderate-intensity exercise).  Most adults should also do strengthening exercises at least twice a week. This is in addition to the moderate-intensity exercise.  Maintain a healthy weight  Body mass index (BMI) is a measurement that can be used to identify possible weight problems. It estimates body fat based on height and weight. Your health care provider can help determine your BMI and help you achieve or maintain a healthy weight.  For females 20 years of age and older:   A BMI below 18.5 is considered underweight.  A BMI of 18.5 to 24.9 is normal.  A BMI of 25 to 29.9 is considered overweight.  A BMI of 30 and above is considered obese.  Watch levels of cholesterol and blood lipids  You should start having your blood tested for lipids and cholesterol at 51 years of age, then have this test every 5 years.  You may need to have your cholesterol levels checked more often if:  Your lipid or cholesterol levels are high.  You are older than 50 years of age.  You are at high risk for heart disease.  CANCER SCREENING   Lung Cancer  Lung cancer screening is recommended for adults 55-80 years old who are at high risk for lung cancer because of a history of smoking.  A yearly low-dose CT scan of the lungs is recommended for people who:  Currently smoke.  Have quit within the past 15 years.  Have at least a 30-pack-year history of smoking. A pack year is smoking an average of one pack of cigarettes a day for 1 year.  Yearly screening should continue until it has been 15 years since you quit.  Yearly screening should stop if you develop a health problem that would prevent you from having lung cancer treatment.  Breast Cancer  Practice breast  self-awareness. This means understanding how your breasts normally appear and feel.  It also means doing regular breast self-exams. Let your health care provider know about any changes, no matter how small.  If you are in your 20s or 30s, you should have a clinical breast exam (CBE) by a health care provider every 1-3 years as part of a regular health exam.  If you are 40 or older, have a CBE every year. Also consider having a breast X-ray (mammogram) every year.  If you have a family history of breast cancer, talk to your health care provider about genetic screening.  If you are at high risk for breast cancer, talk to your health care provider about having an MRI and a mammogram every year.  Breast cancer gene (BRCA) assessment is recommended for women who have family members with BRCA-related cancers. BRCA-related cancers include:  Breast.  Ovarian.  Tubal.  Peritoneal cancers.  Results of the assessment will determine the need for genetic counseling and BRCA1 and BRCA2 testing. Cervical Cancer Routine pelvic examinations to screen for cervical cancer are no longer recommended for nonpregnant women who are considered low risk for cancer of the pelvic organs (ovaries, uterus, and vagina)   and who do not have symptoms. A pelvic examination may be necessary if you have symptoms including those associated with pelvic infections. Ask your health care provider if a screening pelvic exam is right for you.   The Pap test is the screening test for cervical cancer for women who are considered at risk.  If you had a hysterectomy for a problem that was not cancer or a condition that could lead to cancer, then you no longer need Pap tests.  If you are older than 65 years, and you have had normal Pap tests for the past 10 years, you no longer need to have Pap tests.  If you have had past treatment for cervical cancer or a condition that could lead to cancer, you need Pap tests and screening for  cancer for at least 20 years after your treatment.  If you no longer get a Pap test, assess your risk factors if they change (such as having a new sexual partner). This can affect whether you should start being screened again.  Some women have medical problems that increase their chance of getting cervical cancer. If this is the case for you, your health care provider may recommend more frequent screening and Pap tests.  The human papillomavirus (HPV) test is another test that may be used for cervical cancer screening. The HPV test looks for the virus that can cause cell changes in the cervix. The cells collected during the Pap test can be tested for HPV.  The HPV test can be used to screen women 30 years of age and older. Getting tested for HPV can extend the interval between normal Pap tests from three to five years.  An HPV test also should be used to screen women of any age who have unclear Pap test results.  After 51 years of age, women should have HPV testing as often as Pap tests.  Colorectal Cancer  This type of cancer can be detected and often prevented.  Routine colorectal cancer screening usually begins at 50 years of age and continues through 51 years of age.  Your health care provider may recommend screening at an earlier age if you have risk factors for colon cancer.  Your health care provider may also recommend using home test kits to check for hidden blood in the stool.  A small camera at the end of a tube can be used to examine your colon directly (sigmoidoscopy or colonoscopy). This is done to check for the earliest forms of colorectal cancer.  Routine screening usually begins at age 50.  Direct examination of the colon should be repeated every 5-10 years through 51 years of age. However, you may need to be screened more often if early forms of precancerous polyps or small growths are found. Skin Cancer  Check your skin from head to toe regularly.  Tell your health  care provider about any new moles or changes in moles, especially if there is a change in a mole's shape or color.  Also tell your health care provider if you have a mole that is larger than the size of a pencil eraser.  Always use sunscreen. Apply sunscreen liberally and repeatedly throughout the day.  Protect yourself by wearing long sleeves, pants, a wide-brimmed hat, and sunglasses whenever you are outside. HEART DISEASE, DIABETES, AND HIGH BLOOD PRESSURE   Have your blood pressure checked at least every 1-2 years. High blood pressure causes heart disease and increases the risk of stroke.  If you are   between 88 years and 67 years old, ask your health care provider if you should take aspirin to prevent strokes.  Have regular diabetes screenings. This involves taking a blood sample to check your fasting blood sugar level.  If you are at a normal weight and have a low risk for diabetes, have this test once every three years after 51 years of age.  If you are overweight and have a high risk for diabetes, consider being tested at a younger age or more often. PREVENTING INFECTION  Hepatitis B  If you have a higher risk for hepatitis B, you should be screened for this virus. You are considered at high risk for hepatitis B if:  You were born in a country where hepatitis B is common. Ask your health care provider which countries are considered high risk.  Your parents were born in a high-risk country, and you have not been immunized against hepatitis B (hepatitis B vaccine).  You have HIV or AIDS.  You use needles to inject street drugs.  You live with someone who has hepatitis B.  You have had sex with someone who has hepatitis B.  You get hemodialysis treatment.  You take certain medicines for conditions, including cancer, organ transplantation, and autoimmune conditions. Hepatitis C  Blood testing is recommended for:  Everyone born from [birth year] through 1967.  Anyone with known  risk factors for hepatitis C. Sexually transmitted infections (STIs)  You should be screened for sexually transmitted infections (STIs) including gonorrhea and chlamydia if:  You are sexually active and are younger than 51 years of age.  You are older than 51 years of age and your health care provider tells you that you are at risk for this type of infection.  Your sexual activity has changed since you were last screened and you are at an increased risk for chlamydia or gonorrhea. Ask your health care provider if you are at risk.  If you do not have HIV, but are at risk, it may be recommended that you take a prescription medicine daily to prevent HIV infection. This is called pre-exposure prophylaxis (PrEP). You are considered at risk if:  You are sexually active and do not regularly use condoms or know the HIV status of your partner(s).  You take drugs by injection.  You are sexually active with a partner who has HIV. Talk with your health care provider about whether you are at high risk of being infected with HIV. If you choose to begin PrEP, you should first be tested for HIV. You should then be tested every 3 months for as long as you are taking PrEP.  PREGNANCY   If you are premenopausal and you may become pregnant, ask your health care provider about preconception counseling.  If you may become pregnant, take 400 to 800 micrograms (mcg) of folic acid every day.  If you want to prevent pregnancy, talk to your health care provider about birth control (contraception). OSTEOPOROSIS AND MENOPAUSE   Osteoporosis is a disease in which the bones lose minerals and strength with aging. This can result in serious bone fractures. Your risk for osteoporosis can be identified using a bone density scan.  If you are 16 years of age or older, or if you are at risk for osteoporosis and fractures, ask your health care provider if you should be screened.  Ask your health care provider whether you  should take a calcium or vitamin D supplement to lower your risk for osteoporosis.  Menopause  may have certain physical symptoms and risks.  Hormone replacement therapy may reduce some of these symptoms and risks. Talk to your health care provider about whether hormone replacement therapy is right for you.  HOME CARE INSTRUCTIONS   Schedule regular health, dental, and eye exams.  Stay current with your immunizations.   Do not use any tobacco products including cigarettes, chewing tobacco, or electronic cigarettes.  If you are pregnant, do not drink alcohol.  If you are breastfeeding, limit how much and how often you drink alcohol.  Limit alcohol intake to no more than 1 drink per day for nonpregnant women. One drink equals 12 ounces of beer, 5 ounces of wine, or 1 ounces of hard liquor.  Do not use street drugs.  Do not share needles.  Ask your health care provider for help if you need support or information about quitting drugs.  Tell your health care provider if you often feel depressed.  Tell your health care provider if you have ever been abused or do not feel safe at home. Document Released: 05/19/2011 Document Revised: 03/20/2014 Document Reviewed: 10/05/2013 Exit[employer] Patient Information 2015 [employer]. This information is not intended to replace advice given to you by your health care provider. Make sure you discuss any questions you have with your health care provider.  

## 2015-03-09 NOTE — Progress Notes (Signed)
Subjective:    Patient ID: Kendra Michael, female    DOB: 1966-10-04, 49 y.o.   MRN: [id number]  Thyroid Problem Presents for follow-up visit. Symptoms include anxiety. Patient reports no depressed mood, diarrhea, dry skin, hair loss or palpitations. The symptoms have been stable. Past treatments include levothyroxine. The treatment provided significant relief. Her past medical history is significant for hyperlipidemia. There is no history of heart failure.  Anxiety Presents for follow-up visit. Symptoms include insomnia, nervous/anxious behavior and shortness of breath (Pt states that is r/t to her COPD). Patient reports no depressed mood, malaise, palpitations or panic. Symptoms occur rarely. The quality of sleep is poor. Nighttime awakenings: several.   Her past medical history is significant for anxiety/panic attacks. There is no history of depression. Past treatments include benzodiazephines.  Hypertension This is a chronic problem. The current episode started more than 1 year ago. The problem has been resolved since onset. The problem is controlled. Associated symptoms include anxiety and shortness of breath (Pt states that is r/t to her COPD). Pertinent negatives include no headaches, palpitations or peripheral edema. Past treatments include ACE inhibitors. The current treatment provides significant improvement. Hypertensive end-organ damage includes a thyroid problem. There is no history of kidney disease, CAD/MI, CVA or heart failure. There is no history of sleep apnea.  Hyperlipidemia This is a chronic problem. The current episode started more than 1 year ago. The problem is uncontrolled. Recent lipid tests were reviewed and are high. Factors aggravating her hyperlipidemia include fatty foods and smoking. Associated symptoms include shortness of breath (Pt states that is r/t to her COPD). Current antihyperlipidemic treatment includes statins. The current treatment provides mild improvement of  lipids. Risk factors for coronary artery disease include dyslipidemia, family history, hypertension, obesity and post-menopausal.  COPD Pt currently taking Symbicort daily.  Pt states this is working well.  Pt told to start taking medication BID.   *Pt states she goes to a pain clinic for her pain medication for DDD and chronic back pain.   Review of Systems  Constitutional: Negative.   HENT: Negative.   Eyes: Negative.   Respiratory: Positive for shortness of breath (Pt states that is r/t to her COPD).   Cardiovascular: Negative.  Negative for palpitations.  Gastrointestinal: Negative.  Negative for diarrhea.  Endocrine: Negative.   Genitourinary: Negative.   Musculoskeletal: Positive for back pain.  Neurological: Negative.  Negative for headaches.  Hematological: Negative.   Psychiatric/Behavioral: The patient is nervous/anxious and has insomnia.   All other systems reviewed and are negative.      Objective:   Physical Exam  Constitutional: She is oriented to person, place, and time. She appears well-developed and well-nourished. No distress.  HENT:  Head: Normocephalic and atraumatic.  Right Ear: External ear normal.  Left Ear: External ear normal.  Nose: Nose normal.  Mouth/Throat: Oropharynx is clear and moist.  Eyes: Pupils are equal, round, and reactive to light.  Neck: Normal range of motion. Neck supple. No thyromegaly present.  Cardiovascular: Normal rate, regular rhythm, normal heart sounds and intact distal pulses.   No murmur heard. Pulmonary/Chest: Effort normal and breath sounds normal. No respiratory distress. She has no wheezes.  Abdominal: Soft. Bowel sounds are normal. She exhibits no distension. There is no tenderness.  Musculoskeletal: Normal range of motion. She exhibits no edema or tenderness.  Neurological: She is alert and oriented to person, place, and time. She has normal reflexes. No cranial nerve deficit.  Skin: Skin is warm and  dry.  Psychiatric:  She has a normal mood and affect. Her behavior is normal. Judgment and thought content normal.  Vitals reviewed.   BP 131/72 mmHg  Pulse 68  Temp(Src) 97.9 F (36.6 C) (Oral)  Ht 5' 4"  (1.626 m)  Wt 171 lb 3.2 oz (77.656 kg)  BMI 29.37 kg/m2       Assessment & Plan:  1. Essential hypertension - CMP14+EGFR  2. Hypothyroidism, unspecified hypothyroidism type - CMP14+EGFR - Thyroid Panel With TSH - levothyroxine (SYNTHROID, LEVOTHROID) 25 MCG tablet; Take 1 tablet (25 mcg total) by mouth daily.  Dispense: 90 tablet; Refill: 1  3. HLD (hyperlipidemia) - CMP14+EGFR  4. COPD exacerbation - CMP14+EGFR  5. GAD (generalized anxiety disorder) - CMP14+EGFR  6. DDD (degenerative disc disease), lumbar - CMP14+EGFR  7. Encounter for vitamin deficiency screening - Vit D  25 hydroxy (rtn osteoporosis monitoring)  8. Encounter for screening colonoscopy - Ambulatory referral to Gastroenterology   Continue all meds Labs pending Health Maintenance reviewed Diet and exercise encouraged RTO 6 months  Evelina Dun, FNP

## 2015-03-10 ENCOUNTER — Other Ambulatory Visit: Payer: Self-pay | Admitting: Family Medicine

## 2015-03-10 LAB — VITAMIN D 25 HYDROXY (VIT D DEFICIENCY, FRACTURES): VIT D 25 HYDROXY: 43.9 ng/mL (ref 30.0–100.0)

## 2015-03-10 LAB — CMP14+EGFR
ALBUMIN: 3.9 g/dL (ref 3.5–5.5)
ALK PHOS: 81 IU/L (ref 39–117)
ALT: 15 IU/L (ref 0–32)
AST: 20 IU/L (ref 0–40)
Albumin/Globulin Ratio: 1.6 (ref 1.1–2.5)
BUN/Creatinine Ratio: 10 (ref 9–23)
BUN: 9 mg/dL (ref 6–24)
Bilirubin Total: 0.3 mg/dL (ref 0.0–1.2)
CO2: 24 mmol/L (ref 18–29)
Calcium: 9.4 mg/dL (ref 8.7–10.2)
Chloride: 99 mmol/L (ref 97–108)
Creatinine, Ser: 0.93 mg/dL (ref 0.57–1.00)
GFR calc Af Amer: 83 mL/min/{1.73_m2} (ref >59)
GFR, EST NON AFRICAN AMERICAN: 72 mL/min/{1.73_m2} (ref >59)
GLOBULIN, TOTAL: 2.4 g/dL (ref 1.5–4.5)
Glucose: 102 mg/dL — ABNORMAL HIGH (ref 65–99)
POTASSIUM: 4.3 mmol/L (ref 3.5–5.2)
SODIUM: 138 mmol/L (ref 134–144)
Total Protein: 6.3 g/dL (ref 6.0–8.5)

## 2015-03-10 LAB — THYROID PANEL WITH TSH
FREE THYROXINE INDEX: 2.9 (ref 1.2–4.9)
T3 Uptake Ratio: 28 % (ref 24–39)
T4, Total: 10.3 ug/dL (ref 4.5–12.0)
TSH: 6.42 u[IU]/mL — ABNORMAL HIGH (ref 0.450–4.500)

## 2015-03-12 ENCOUNTER — Encounter (INDEPENDENT_AMBULATORY_CARE_PROVIDER_SITE_OTHER): Payer: Self-pay | Admitting: *Deleted

## 2015-03-12 ENCOUNTER — Telehealth: Payer: Self-pay | Admitting: *Deleted

## 2015-03-12 ENCOUNTER — Other Ambulatory Visit: Payer: Self-pay | Admitting: Family

## 2015-03-12 IMAGING — CR DG CHEST 2V
2 series · 2 of 2 positions shown · non-contrast
Comparison: Chest radiograph 04/01/2014; chest CT 04/07/2014.

CLINICAL DATA: Followup evaluation.

EXAM:
CHEST  2 VIEW

[view not recorded (1 of 2)]
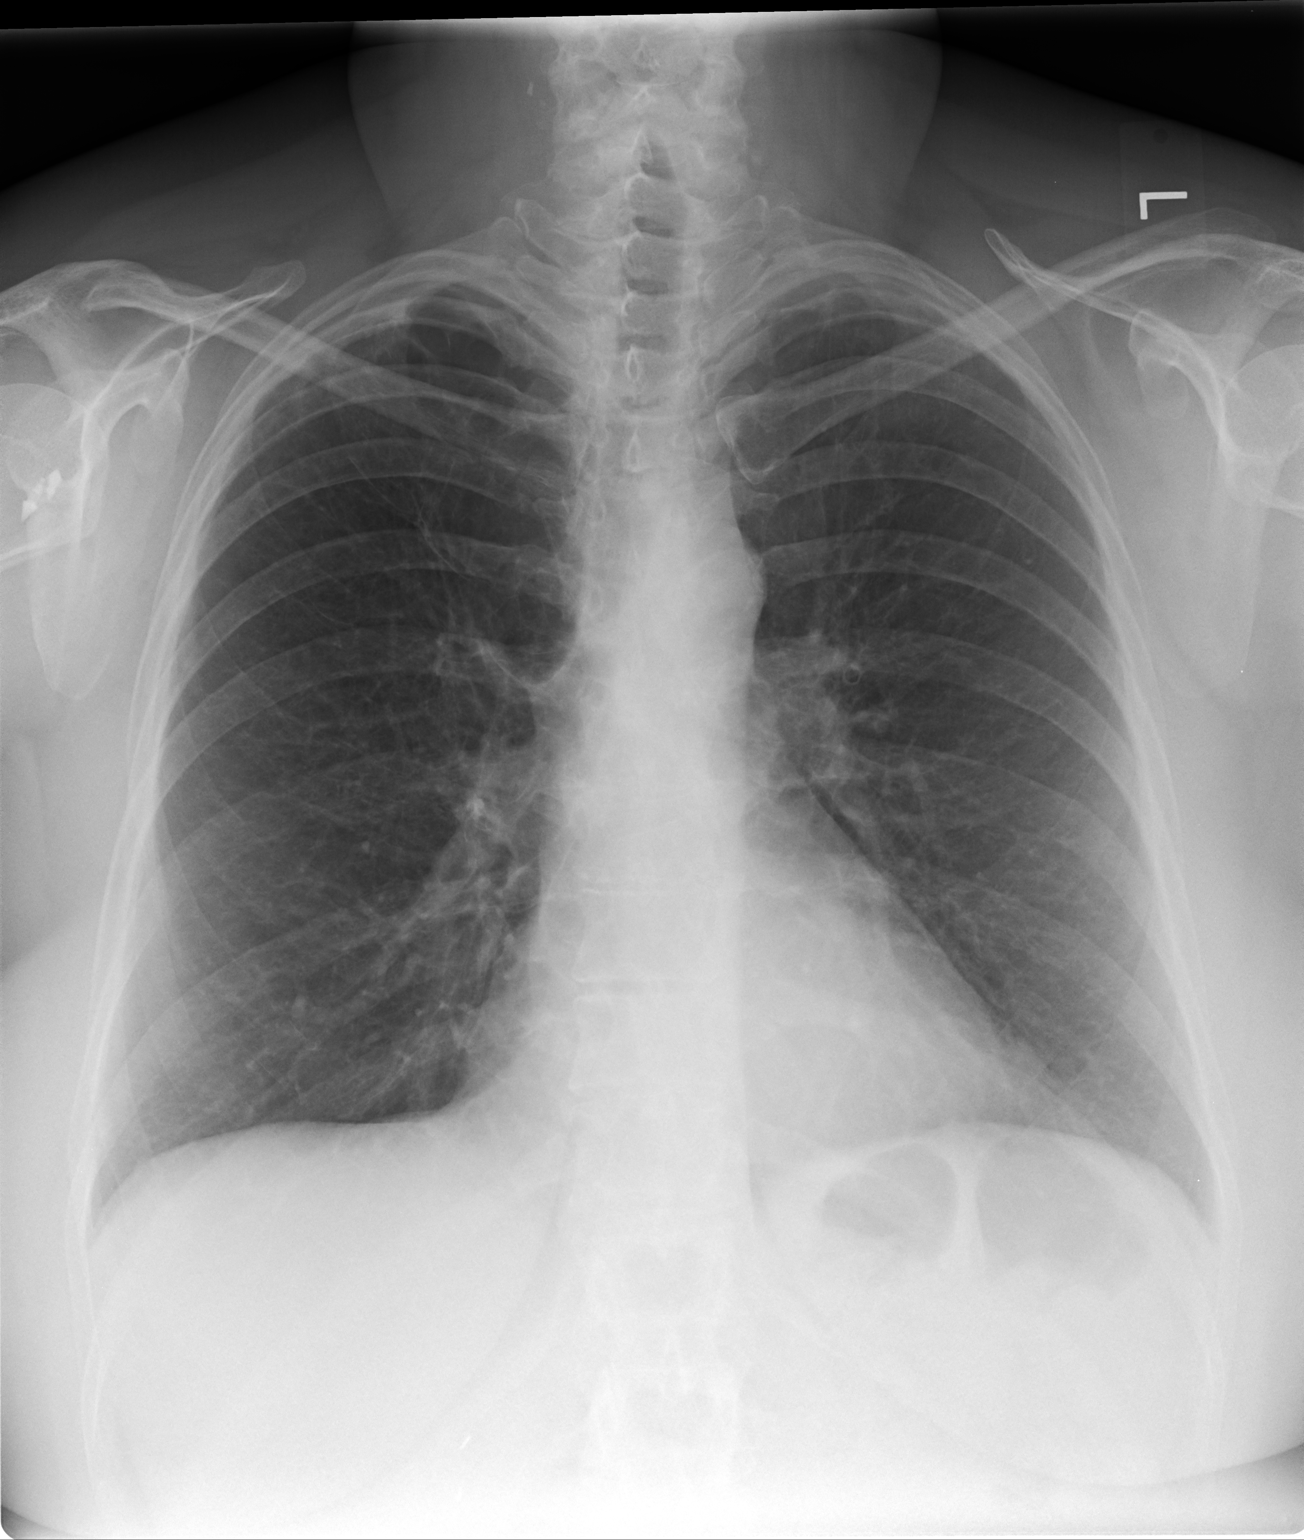

[view not recorded (2 of 2)]
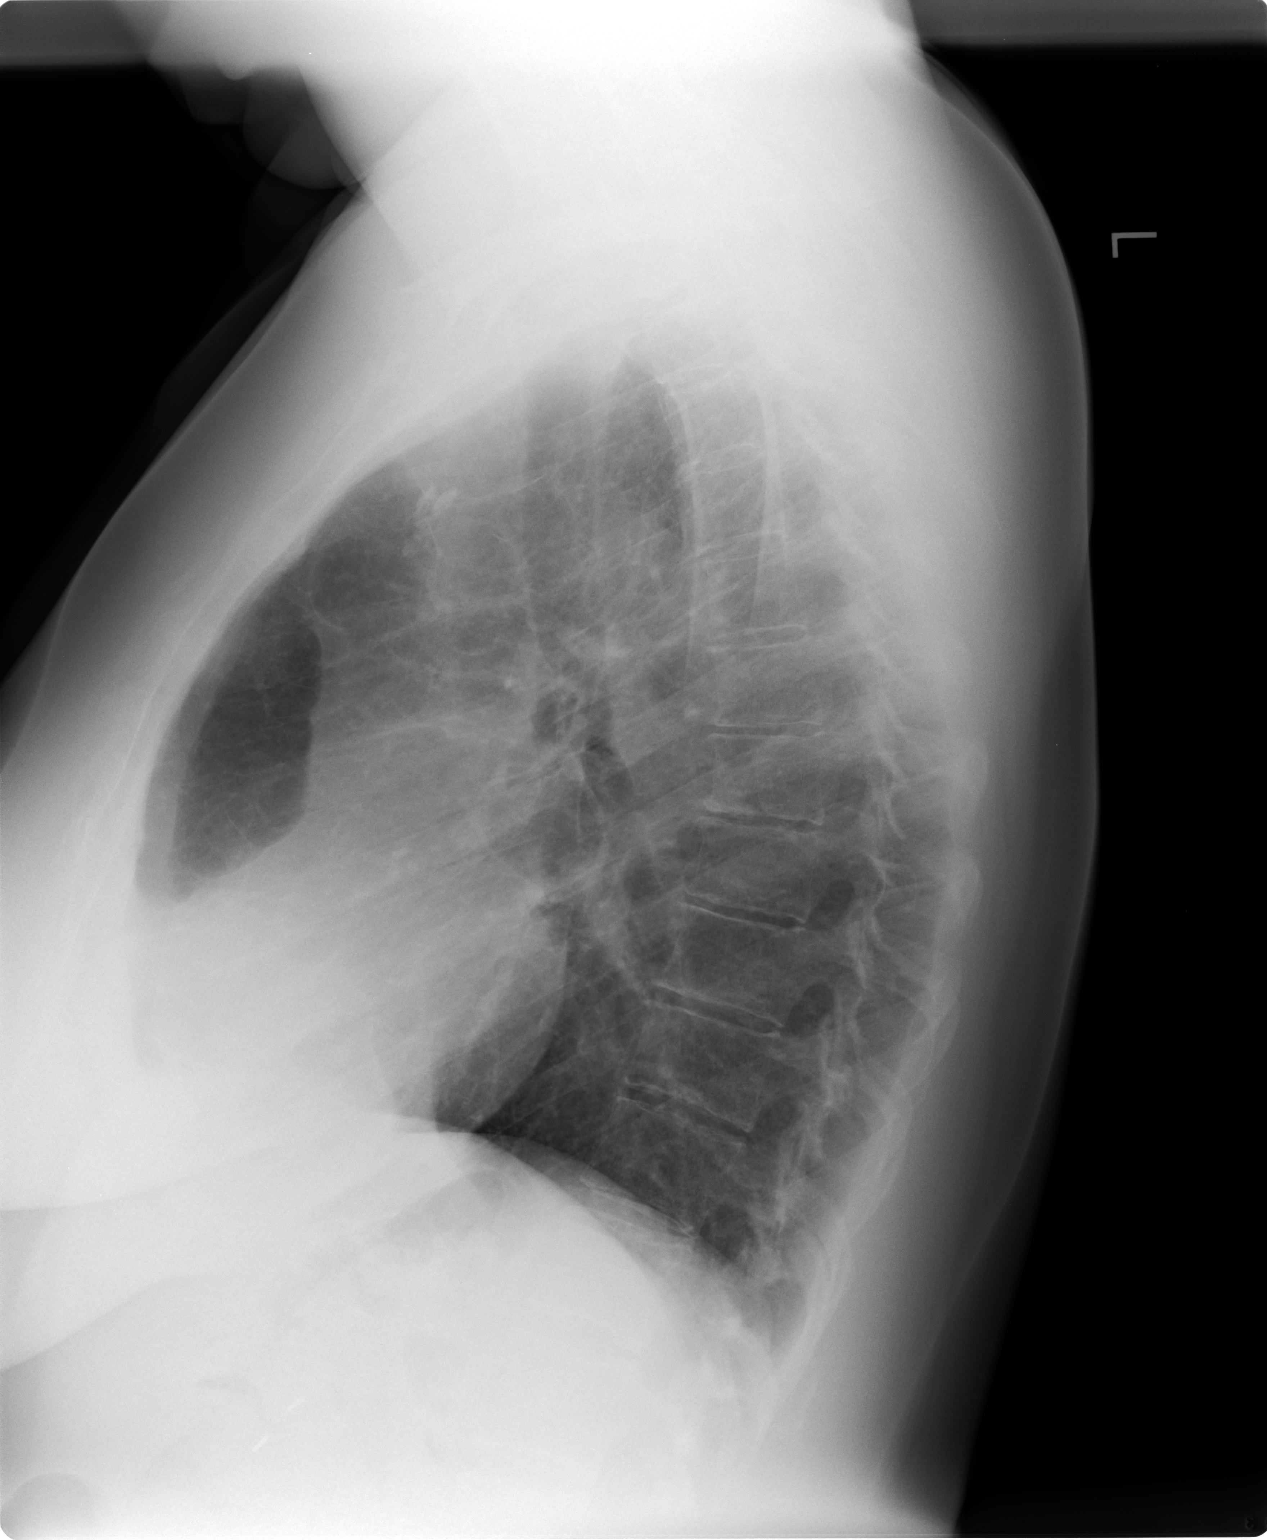

[2 of 2 positions shown; findings below may reference images not displayed]

FINDINGS: Normal cardiac and mediastinal contours. No consolidative pulmonary
opacities. No pleural effusion or pneumothorax. Biapical bullous and
emphysematous change. Postsurgical change proximal right humerus.
IMPRESSION: No active cardiopulmonary disease.

## 2015-03-12 MED ORDER — LEVOTHYROXINE SODIUM 50 MCG PO TABS: 50.0000 ug | ORAL_TABLET | Freq: Every day | ORAL | Status: DC

## 2015-03-12 NOTE — Telephone Encounter (Signed)
-----   Message from Sharion Balloon, [place] sent at 03/12/2015  8:32 AM EDT ----- Kidney and liver function stable Thyroid levels elevated- Levothyroxine increased to 50 mg- Pt will need follow up appt in 8 weeks Vit D levels WNL

## 2015-03-12 NOTE — Telephone Encounter (Signed)
Aware of increase in thyroid medication.

## 2015-03-18 ENCOUNTER — Emergency Department (HOSPITAL_COMMUNITY)
Admission: EM | Admit: 2015-03-18 | Discharge: 2015-03-18 | Disposition: A | Discharge status: Home / Self Care | Payer: Medicaid Other | Attending: Emergency Medicine | Admitting: Emergency Medicine

## 2015-03-18 ENCOUNTER — Encounter (HOSPITAL_COMMUNITY): Payer: Self-pay | Admitting: Emergency Medicine

## 2015-03-18 DIAGNOSIS — Z79899 Other long term (current) drug therapy: Secondary | ICD-10-CM | POA: Diagnosis not present

## 2015-03-18 DIAGNOSIS — M25511 Pain in right shoulder: Secondary | ICD-10-CM

## 2015-03-18 DIAGNOSIS — Z791 Long term (current) use of non-steroidal anti-inflammatories (NSAID): Secondary | ICD-10-CM | POA: Diagnosis not present

## 2015-03-18 DIAGNOSIS — E079 Disorder of thyroid, unspecified: Secondary | ICD-10-CM | POA: Insufficient documentation

## 2015-03-18 DIAGNOSIS — M1990 Unspecified osteoarthritis, unspecified site: Secondary | ICD-10-CM | POA: Diagnosis not present

## 2015-03-18 DIAGNOSIS — Z7982 Long term (current) use of aspirin: Secondary | ICD-10-CM | POA: Diagnosis not present

## 2015-03-18 DIAGNOSIS — E785 Hyperlipidemia, unspecified: Secondary | ICD-10-CM | POA: Insufficient documentation

## 2015-03-18 DIAGNOSIS — F419 Anxiety disorder, unspecified: Secondary | ICD-10-CM | POA: Insufficient documentation

## 2015-03-18 DIAGNOSIS — I10 Essential (primary) hypertension: Secondary | ICD-10-CM | POA: Diagnosis not present

## 2015-03-18 MED ORDER — DICLOFENAC SODIUM 75 MG PO TBEC: 75.0000 mg | DELAYED_RELEASE_TABLET | Freq: Two times a day (BID) | ORAL | Status: DC

## 2015-03-18 NOTE — ED Notes (Signed)
Patient c/o right shoulder pain. Per patient has had rotator cuff repair with a bone spur removed 3 years ago. Patient states she was mopping floor on Thursday and woke Friday unable to move arm due to pain. CNS intact. Radial pulse strong, capillary refill WNL.

## 2015-03-18 NOTE — Discharge Instructions (Signed)
Shoulder Pain  The shoulder is the joint that connects your arm to your body. Muscles and band-like tissues that connect bones to muscles (tendons) hold the joint together. Shoulder pain is felt if an injury or medical problem affects one or more parts of the shoulder.  HOME CARE   · Put ice on the sore area.  ¨ Put ice in a plastic bag.  ¨ Place a towel between your skin and the bag.  ¨ Leave the ice on for 15-20 minutes, 03-04 times a day for the first 2 days.  · Stop using cold packs if they do not help with the pain.  · If you were given something to keep your shoulder from moving (sling; shoulder immobilizer), wear it as told. Only take it off to shower or bathe.  · Move your arm as little as possible, but keep your hand moving to prevent puffiness (swelling).  · Squeeze a soft ball or foam pad as much as possible to help prevent swelling.  · Take medicine as told by your doctor.  GET HELP IF:  · You have progressing new pain in your arm, hand, or fingers.  · Your hand or fingers get cold.  · Your medicine does not help lessen your pain.  GET HELP RIGHT AWAY IF:   · Your arm, hand, or fingers are numb or tingling.  · Your arm, hand, or fingers are puffy (swollen), painful, or turn white or blue.  MAKE SURE YOU:   · Understand these instructions.  · Will watch your condition.  · Will get help right away if you are not doing well or get worse.  Document Released: 04/21/2008 Document Revised: 03/20/2014 Document Reviewed: 05/17/2012  Exit[employer]® Patient Information ©2015 [employer]. This information is not intended to replace advice given to you by your health care provider. Make sure you discuss any questions you have with your health care provider.

## 2015-03-18 NOTE — ED Provider Notes (Signed)
CSN: [id number]     Arrival date & time 03/18/15  1242 History  This chart was scribed for non-physician practitioner Kem Parkinson, PA-C working with Daleen Bo, MD by Zola Button, ED Scribe. This patient was seen in room APFT22/APFT22 and the patient's care was started at 1:43 PM.      Chief Complaint  Patient presents with  . Shoulder Pain   The history is provided by the patient. No language interpreter was used.   HPI Comments: Kendra Michael is a 49 y.o. female with a PSHx of right rotator cuff repair with a bone spur removed 4 years ago who presents to the Emergency Department complaining of gradual onset right shoulder pain radiating into her elbow that started 3 days ago after she was mopping a floor. Her pain progressively worsened the next day. She notes she cannot raise her right arm up, straighten her arm, or lift anything due to the pain. She has tried muscle relaxer and muscle rubs but without relief. Patient denies pain to her neck , swelling, headache and numbness. Her shoulder surgery was done by Dr. Alphonzo Cruise.   Past Medical History  Diagnosis Date  . Hypertension   . Hyperlipidemia   . Thyroid disease   . Substance abuse   . Arthritis   . Anxiety    Past Surgical History  Procedure Laterality Date  . Appendectomy    . Cholecystectomy    . Knee surgery Bilateral   . Shoulder surgery Right   . Hammer toe surgery Bilateral   . Abdominal hysterectomy     Family History  Problem Relation Age of Onset  . Stroke Mother   . Heart attack Father   . Heart attack Brother    History  Substance Use Topics  . Smoking status: Current Every Day Smoker -- 1.00 packs/day for 30 years    Types: Cigarettes  . Smokeless tobacco: Never Used  . Alcohol Use: No   OB History    Gravida Para Term Preterm AB TAB SAB Ectopic Multiple Living   6 4 4  2  2   4      Review of Systems  Constitutional: Negative for fever and chills.  Genitourinary: Negative for dysuria and  difficulty urinating.  Musculoskeletal: Positive for joint swelling and arthralgias. Negative for neck pain.  Skin: Negative for color change and wound.  Neurological: Negative for numbness.  All other systems reviewed and are negative.     Allergies  Review of patient's allergies indicates no known allergies.  Home Medications   Prior to Admission medications   Medication Sig Start Date End Date Taking? Authorizing Provider  aspirin 81 MG tablet Take 81 mg by mouth daily.    Historical Provider, MD  CHANTIX 1 MG tablet Take 1 mg by mouth 2 (two) times daily. 01/11/15   Historical Provider, MD  clonazePAM (KLONOPIN) 1 MG tablet Take 1 tablet (1 mg total) by mouth daily as needed for anxiety. 12/21/14   Lysbeth Penner, FNP  fentaNYL (DURAGESIC - DOSED MCG/HR) 50 MCG/HR Place 50 mcg onto the skin every 3 (three) days.    Historical Provider, MD  levothyroxine (SYNTHROID, LEVOTHROID) 50 MCG tablet Take 1 tablet (50 mcg total) by mouth daily. 03/12/15   Sharion Balloon, FNP  lisinopril (PRINIVIL,ZESTRIL) 5 MG tablet TAKE 1 TABLET BY MOUTH EVERY DAY 02/13/15   Chipper Herb, MD  meloxicam (MOBIC) 15 MG tablet Take 15 mg by mouth daily.    Historical Provider, MD  Omega-3 Fatty Acids (FISH OIL) 1000 MG CAPS Take 2 capsules by mouth daily.     Historical Provider, MD  oxyCODONE-acetaminophen (PERCOCET) 10-325 MG per tablet Take 1 tablet by mouth 3 (three) times daily.    Historical Provider, MD  pravastatin (PRAVACHOL) 40 MG tablet TAKE 1 TABLET BY MOUTH AT BEDTIME 03/12/15   Sharion Balloon, FNP  SYMBICORT 160-4.5 MCG/ACT inhaler INHALE 2 PUFFS INTO THE LUNGS 2 TIMES DAILY. 10/20/14   Lysbeth Penner, FNP   BP 127/79 mmHg  Pulse 72  Temp(Src) 98.2 F (36.8 C) (Oral)  Resp 18  Ht 5\' 4"  (1.626 m)  Wt 168 lb (76.204 kg)  BMI 28.82 kg/m2  SpO2 100% Physical Exam  Constitutional: She is oriented to person, place, and time. She appears well-developed and well-nourished. No distress.  HENT:   Head: Normocephalic and atraumatic.  Mouth/Throat: Oropharynx is clear and moist. No oropharyngeal exudate.  Neck: Normal range of motion. Neck supple.  Cardiovascular: Normal rate and regular rhythm.   Pulmonary/Chest: Effort normal. No respiratory distress.  Musculoskeletal: She exhibits tenderness. She exhibits no edema.  Tenderness to the anterior right shoulder. No erythema or excessive warmth. Pain reproduced with abduction. Radial pulse is brisk, distal sensation and grip strength is strong and equal bilaterally  Neurological: She is alert and oriented to person, place, and time. No cranial nerve deficit.  Skin: Skin is warm and dry. No rash noted. No erythema.  Psychiatric: She has a normal mood and affect. Her behavior is normal.  Nursing note and vitals reviewed.   ED Course  Procedures  DIAGNOSTIC STUDIES: Oxygen Saturation is 100% on room air, normal by my interpretation.    COORDINATION OF CARE: 1:48 PM-Discussed treatment plan which includes follow-up with Dr. Alphonzo Cruise with patient/guardian at bedside and patient/guardian agreed to plan. Recommended patient use ice.   Labs Review Labs Reviewed - No data to display  Imaging Review No results found.   EKG Interpretation None      MDM   Final diagnoses:  Shoulder pain, right  Patient filled prescriptions for Fentanyl 55mcg/hr patch (15) and oxycodone-acetaminophen 10-325 (90) on 02/28/2015.  Pt is well appearing, vitals stable.  Pt understands that additional narcotics or imaging are not indicated at this time and she will need to arrange ortho or pain management f/u.  No concerning sx's for septic joint.  I personally performed the services described in this documentation, which was scribed in my presence. The recorded information has been reviewed and is accurate.    Kem Parkinson, PA-C 03/20/15 1841  Daleen Bo, MD 03/22/15 1409

## 2015-03-19 ENCOUNTER — Telehealth: Payer: Self-pay | Admitting: Family

## 2015-03-19 DIAGNOSIS — M25511 Pain in right shoulder: Secondary | ICD-10-CM

## 2015-03-19 NOTE — Telephone Encounter (Signed)
Patient went to [facility] and they recommend she go back to orthopedic

## 2015-03-19 NOTE — Telephone Encounter (Signed)
Orthopedic referral sent per pt's request

## 2015-04-28 ENCOUNTER — Other Ambulatory Visit: Payer: Self-pay | Admitting: Family Medicine

## 2015-04-30 NOTE — Telephone Encounter (Signed)
Last seen 03/09/15 Kendra Michael  If approved route to nurse to call into [facility]  [phone]

## 2015-04-30 NOTE — Telephone Encounter (Signed)
[facility] 

## 2015-05-02 ENCOUNTER — Other Ambulatory Visit: Payer: Self-pay | Admitting: Family Medicine

## 2015-05-08 ENCOUNTER — Other Ambulatory Visit: Payer: Self-pay

## 2015-05-08 MED ORDER — LISINOPRIL 5 MG PO TABS
5.0000 mg | ORAL_TABLET | Freq: Every day | ORAL | Status: DC
Start: 2015-05-08 — End: 2016-07-03

## 2015-07-24 ENCOUNTER — Encounter: Payer: Self-pay | Admitting: Family

## 2015-07-24 ENCOUNTER — Ambulatory Visit (INDEPENDENT_AMBULATORY_CARE_PROVIDER_SITE_OTHER): Payer: Medicaid Other | Admitting: Family

## 2015-07-24 VITALS — BP 160/80 | HR 59 | Temp 97.7°F | Ht 64.0 in | Wt 160.2 lb

## 2015-07-24 DIAGNOSIS — E039 Hypothyroidism, unspecified: Secondary | ICD-10-CM | POA: Diagnosis not present

## 2015-07-24 DIAGNOSIS — I10 Essential (primary) hypertension: Secondary | ICD-10-CM | POA: Diagnosis not present

## 2015-07-24 DIAGNOSIS — G4700 Insomnia, unspecified: Secondary | ICD-10-CM | POA: Diagnosis not present

## 2015-07-24 DIAGNOSIS — F411 Generalized anxiety disorder: Secondary | ICD-10-CM | POA: Diagnosis not present

## 2015-07-24 MED ORDER — TRAZODONE HCL 50 MG PO TABS: 50.0000 mg | ORAL_TABLET | Freq: Every evening | ORAL | Status: DC | PRN

## 2015-07-24 MED ORDER — ESCITALOPRAM OXALATE 20 MG PO TABS: 20.0000 mg | ORAL_TABLET | Freq: Every day | ORAL | Status: DC

## 2015-07-24 NOTE — Progress Notes (Signed)
Subjective:    Patient ID: Kendra Michael, female    DOB: 1966-08-23, 49 y.o.   MRN: [id number]  Pt presents to the office today to recheck hypothyroidism. Pt's BP is elevated today. PT states she is very stressed and her back is hurting her. Pt states her back is a 5 out 10. Pt see's Pain management every 2 months. Pt is complaining of chronic Insomnia. Pt states she has battled insomnia for 13 years and has trouble staying asleep. Pt states she wakes up every 2 hours. Pt states she has taken Ambien in the past with mild relief. PT states she is using goodies PM with  Mild relief now. Pt is tearful.  Thyroid Problem Presents for follow-up visit. Symptoms include anxiety, depressed mood, fatigue and hoarse voice. Patient reports no nail problem, palpitations, tremors or visual change. The symptoms have been improving. Past treatments include levothyroxine. The treatment provided moderate relief. There is no history of heart failure.  Hypertension This is a chronic problem. The current episode started more than 1 year ago. The problem has been waxing and waning since onset. The problem is uncontrolled. Associated symptoms include anxiety and malaise/fatigue. Pertinent negatives include no headaches, palpitations, peripheral edema or shortness of breath. Risk factors for coronary artery disease include dyslipidemia, obesity, sedentary lifestyle and smoking/tobacco exposure. Past treatments include ACE inhibitors. Hypertensive end-organ damage includes a thyroid problem. There is no history of kidney disease, CAD/MI, CVA or heart failure.  Anxiety Presents for follow-up visit. Symptoms include depressed mood, excessive worry, insomnia and nervous/anxious behavior. Patient reports no palpitations or shortness of breath. Symptoms occur constantly. The severity of symptoms is moderate. The symptoms are aggravated by family issues. The quality of sleep is non-restorative.   Her past medical history is  significant for anxiety/panic attacks and depression.      Review of Systems  Constitutional: Positive for malaise/fatigue and fatigue.  HENT: Positive for hoarse voice.   Eyes: Negative.   Respiratory: Negative.  Negative for shortness of breath.   Cardiovascular: Negative.  Negative for palpitations.  Gastrointestinal: Negative.   Endocrine: Negative.   Genitourinary: Negative.   Musculoskeletal: Negative.   Neurological: Negative.  Negative for tremors and headaches.  Hematological: Negative.   Psychiatric/Behavioral: The patient is nervous/anxious and has insomnia.   All other systems reviewed and are negative.      Objective:   Physical Exam  Constitutional: She is oriented to person, place, and time. She appears well-developed and well-nourished. No distress.  Pt tearful and crying during exam- Pt going through family issues with son at this time  HENT:  Head: Normocephalic and atraumatic.  Eyes: Pupils are equal, round, and reactive to light.  Neck: Normal range of motion. Neck supple. No thyromegaly present.  Cardiovascular: Normal rate, regular rhythm, normal heart sounds and intact distal pulses.   No murmur heard. Pulmonary/Chest: Effort normal and breath sounds normal. No respiratory distress. She has no wheezes.  Abdominal: Soft. Bowel sounds are normal. She exhibits no distension. There is no tenderness.  Musculoskeletal: Normal range of motion. She exhibits no edema or tenderness.  Neurological: She is alert and oriented to person, place, and time. She has normal reflexes. No cranial nerve deficit.  Skin: Skin is warm and dry.  Psychiatric: She has a normal mood and affect. Her behavior is normal. Judgment and thought content normal.  Vitals reviewed.     BP 160/80 mmHg  Pulse 59  Temp(Src) 97.7 F (36.5 C) (Oral)  Ht 5'  4" (1.626 m)  Wt 160 lb 3.2 oz (72.666 kg)  BMI 27.48 kg/m2     Assessment & Plan:  1. Essential hypertension -I believe BP is  elevated today related to GAD and stress-Pt very tearful and emotional today- Last visit pt's BP was WNL - CMP14+EGFR  2. Hypothyroidism, unspecified hypothyroidism type - CMP14+EGFR - Thyroid Panel With TSH  3. Insomnia -Sleep ritual - CMP14+EGFR  4. GAD (generalized anxiety disorder) -Stress management discussed - escitalopram (LEXAPRO) 20 MG tablet; Take 1 tablet (20 mg total) by mouth daily.  Dispense: 90 tablet; Refill: 1 - CMP14+EGFR   Continue all meds Labs pending Health Maintenance reviewed Diet and exercise encouraged RTO 2 months for GAD and Insomnia  Evelina Dun, FNP

## 2015-07-24 NOTE — Addendum Note (Signed)
Addended by: Earlene Plater on: 07/24/2015 03:47 PM   Modules accepted: Miquel Dunn

## 2015-07-24 NOTE — Patient Instructions (Signed)
Generalized Anxiety Disorder Generalized anxiety disorder (GAD) is a mental disorder. It interferes with life functions, including relationships, work, and school. GAD is different from normal anxiety, which everyone experiences at some point in their lives in response to specific life events and activities. Normal anxiety actually helps us prepare for and get through these life events and activities. Normal anxiety goes away after the event or activity is over.  GAD causes anxiety that is not necessarily related to specific events or activities. It also causes excess anxiety in proportion to specific events or activities. The anxiety associated with GAD is also difficult to control. GAD can vary from mild to severe. People with severe GAD can have intense waves of anxiety with physical symptoms (panic attacks).  SYMPTOMS The anxiety and worry associated with GAD are difficult to control. This anxiety and worry are related to many life events and activities and also occur more days than not for 6 months or longer. People with GAD also have three or more of the following symptoms (one or more in children):  Restlessness.   Fatigue.  Difficulty concentrating.   Irritability.  Muscle tension.  Difficulty sleeping or unsatisfying sleep. DIAGNOSIS GAD is diagnosed through an assessment by your health care provider. Your health care provider will ask you questions aboutyour mood,physical symptoms, and events in your life. Your health care provider may ask you about your medical history and use of alcohol or drugs, including prescription medicines. Your health care provider may also do a physical exam and blood tests. Certain medical conditions and the use of certain substances can cause symptoms similar to those associated with GAD. Your health care provider may refer you to a mental health specialist for further evaluation. TREATMENT The following therapies are usually used to treat GAD:    Medication. Antidepressant medication usually is prescribed for long-term daily control. Antianxiety medicines may be added in severe cases, especially when panic attacks occur.   Talk therapy (psychotherapy). Certain types of talk therapy can be helpful in treating GAD by providing support, education, and guidance. A form of talk therapy called cognitive behavioral therapy can teach you healthy ways to think about and react to daily life events and activities.  Stress managementtechniques. These include yoga, meditation, and exercise and can be very helpful when they are practiced regularly. A mental health specialist can help determine which treatment is best for you. Some people see improvement with one therapy. However, other people require a combination of therapies. Document Released: 02/28/2013 Document Revised: 03/20/2014 Document Reviewed: 02/28/2013 Exit[employer] Patient Information 2015 [employer]. This information is not intended to replace advice given to you by your health care provider. Make sure you discuss any questions you have with your health care provider.  

## 2015-07-25 LAB — CMP14+EGFR
ALBUMIN: 4 g/dL (ref 3.5–5.5)
ALK PHOS: 73 IU/L (ref 39–117)
ALT: 14 IU/L (ref 0–32)
AST: 17 IU/L (ref 0–40)
Albumin/Globulin Ratio: 1.7 (ref 1.1–2.5)
BILIRUBIN TOTAL: 0.2 mg/dL (ref 0.0–1.2)
BUN / CREAT RATIO: 6 — AB (ref 9–23)
BUN: 4 mg/dL — AB (ref 6–24)
CO2: 26 mmol/L (ref 18–29)
Calcium: 9.6 mg/dL (ref 8.7–10.2)
Chloride: 100 mmol/L (ref 97–108)
Creatinine, Ser: 0.68 mg/dL (ref 0.57–1.00)
GFR calc non Af Amer: 102 mL/min/{1.73_m2} (ref >59)
GFR, EST AFRICAN AMERICAN: 117 mL/min/{1.73_m2} (ref >59)
GLOBULIN, TOTAL: 2.3 g/dL (ref 1.5–4.5)
GLUCOSE: 81 mg/dL (ref 65–99)
Potassium: 4.3 mmol/L (ref 3.5–5.2)
SODIUM: 140 mmol/L (ref 134–144)
TOTAL PROTEIN: 6.3 g/dL (ref 6.0–8.5)

## 2015-07-25 LAB — THYROID PANEL WITH TSH
FREE THYROXINE INDEX: 2.6 (ref 1.2–4.9)
T3 Uptake Ratio: 25 % (ref 24–39)
T4, Total: 10.2 ug/dL (ref 4.5–12.0)
TSH: 1.26 u[IU]/mL (ref 0.450–4.500)

## 2015-08-07 ENCOUNTER — Telehealth: Payer: Self-pay | Admitting: Family Medicine

## 2015-08-07 DIAGNOSIS — Z1211 Encounter for screening for malignant neoplasm of colon: Secondary | ICD-10-CM

## 2015-08-07 NOTE — Telephone Encounter (Signed)
Referral sent 

## 2015-08-09 ENCOUNTER — Other Ambulatory Visit: Payer: Self-pay | Admitting: Family

## 2015-08-10 ENCOUNTER — Telehealth: Payer: Self-pay | Admitting: Family Medicine

## 2015-08-14 NOTE — Telephone Encounter (Signed)
LMOM as per DPR  Of 02/2015 with appointment date/time,and stated she should be receiving letter in mail with all information

## 2015-09-19 ENCOUNTER — Telehealth: Payer: Self-pay | Admitting: Family Medicine

## 2015-09-22 ENCOUNTER — Other Ambulatory Visit: Payer: Self-pay | Admitting: Family

## 2015-09-27 ENCOUNTER — Other Ambulatory Visit: Payer: Self-pay | Admitting: *Deleted

## 2015-09-27 MED ORDER — ESCITALOPRAM OXALATE 20 MG PO TABS: 20.0000 mg | ORAL_TABLET | Freq: Every day | ORAL | Status: DC

## 2015-10-15 ENCOUNTER — Other Ambulatory Visit: Payer: Self-pay | Admitting: Family

## 2015-10-15 NOTE — Telephone Encounter (Signed)
Last seen 07/24/15  Kendra Michael   If approved route to [facility]  [phone]

## 2015-10-15 NOTE — Telephone Encounter (Signed)
rx called into pharmacy

## 2015-10-16 ENCOUNTER — Other Ambulatory Visit: Payer: Self-pay

## 2015-10-16 MED ORDER — VARENICLINE TARTRATE 1 MG PO TABS: 1.0000 mg | ORAL_TABLET | Freq: Two times a day (BID) | ORAL | Status: DC

## 2015-10-16 NOTE — Telephone Encounter (Signed)
Last seen 07/24/15  Kendra Michael  This med not on [facility]

## 2015-10-29 ENCOUNTER — Telehealth: Payer: Self-pay | Admitting: Family Medicine

## 2015-10-29 NOTE — Telephone Encounter (Signed)
Please discuss this patient with me during the day on Tuesday as I am not familiar with her other than with telephone visits. She may need to make an appointment to come in and see a provider so that the decision to stop the aspirin can be made appropriately

## 2015-10-30 NOTE — Telephone Encounter (Signed)
Pt can stop aspirin for 7 days for procedure. Please fax note. Thanks

## 2015-11-07 NOTE — Telephone Encounter (Signed)
Letter faxed.

## 2015-11-28 ENCOUNTER — Other Ambulatory Visit: Payer: Self-pay | Admitting: Family

## 2015-11-28 NOTE — Telephone Encounter (Signed)
Last filled 10/24/15, last seen 07/24/15

## 2015-12-03 ENCOUNTER — Other Ambulatory Visit: Payer: Self-pay | Admitting: Family

## 2015-12-04 ENCOUNTER — Other Ambulatory Visit: Payer: Self-pay | Admitting: *Deleted

## 2015-12-04 MED ORDER — PRAVASTATIN SODIUM 80 MG PO TABS: 80.0000 mg | ORAL_TABLET | Freq: Every day | ORAL | Status: DC

## 2015-12-04 NOTE — Telephone Encounter (Signed)
Last seen 07/24/15  Kendra Michael   

## 2015-12-05 NOTE — Telephone Encounter (Signed)
Please forward to Christy 

## 2015-12-28 ENCOUNTER — Other Ambulatory Visit: Payer: Self-pay | Admitting: Family

## 2015-12-29 ENCOUNTER — Other Ambulatory Visit: Payer: Self-pay | Admitting: Family

## 2015-12-31 NOTE — Telephone Encounter (Signed)
Last sen 07/24/15  Christy  If approved route to nurse [facility]  [phone]

## 2016-01-01 NOTE — Telephone Encounter (Signed)
Left message on [facility]

## 2016-01-05 ENCOUNTER — Other Ambulatory Visit: Payer: Self-pay | Admitting: Family

## 2016-01-07 NOTE — Telephone Encounter (Signed)
Last filled 11/29/15, last seen 07/24/15

## 2016-01-29 ENCOUNTER — Other Ambulatory Visit: Payer: Self-pay | Admitting: Family

## 2016-03-08 ENCOUNTER — Other Ambulatory Visit: Payer: Self-pay | Admitting: Family

## 2016-03-10 NOTE — Telephone Encounter (Signed)
Last seen 07/24/15  Kendra Michael   

## 2016-03-15 ENCOUNTER — Other Ambulatory Visit: Payer: Self-pay | Admitting: Family

## 2016-03-17 NOTE — Telephone Encounter (Signed)
Last seen 07/24/15  Kendra Michael  Requesting 90- day supply

## 2016-03-27 ENCOUNTER — Other Ambulatory Visit: Payer: Self-pay

## 2016-03-27 MED ORDER — BUDESONIDE-FORMOTEROL FUMARATE 160-4.5 MCG/ACT IN AERO: INHALATION_SPRAY | RESPIRATORY_TRACT | Status: DC

## 2016-04-01 ENCOUNTER — Telehealth: Payer: Self-pay

## 2016-04-01 NOTE — Telephone Encounter (Signed)
Insurance prior authorized Symbicort x 2 years  [id number]

## 2016-04-07 ENCOUNTER — Other Ambulatory Visit: Payer: Self-pay | Admitting: Family

## 2016-04-08 ENCOUNTER — Telehealth: Payer: Self-pay

## 2016-04-08 NOTE — Telephone Encounter (Signed)
Medicaid prior authorized Symbicort  [phone]

## 2016-04-11 ENCOUNTER — Other Ambulatory Visit: Payer: Self-pay | Admitting: Family

## 2016-04-15 NOTE — Telephone Encounter (Signed)
Last seen 07/24/15 Kendra Michael

## 2016-05-03 ENCOUNTER — Other Ambulatory Visit: Payer: Self-pay | Admitting: Family

## 2016-05-05 NOTE — Telephone Encounter (Signed)
Last seen 07/24/15 Kendra Michael

## 2016-05-14 ENCOUNTER — Other Ambulatory Visit: Payer: Self-pay | Admitting: Family

## 2016-05-23 ENCOUNTER — Other Ambulatory Visit: Payer: Self-pay | Admitting: Family

## 2016-06-04 ENCOUNTER — Other Ambulatory Visit: Payer: Self-pay | Admitting: Family

## 2016-06-04 NOTE — Telephone Encounter (Signed)
Patient aware of refill and has appointment for tomorrow.

## 2016-06-04 NOTE — Telephone Encounter (Signed)
Evette Doffing coverage [place] last seen 07/2015  Nurse Can phone in IF approved

## 2016-06-05 ENCOUNTER — Ambulatory Visit (INDEPENDENT_AMBULATORY_CARE_PROVIDER_SITE_OTHER): Payer: Medicaid Other | Admitting: Family

## 2016-06-05 ENCOUNTER — Other Ambulatory Visit: Payer: Self-pay | Admitting: Family

## 2016-06-05 ENCOUNTER — Encounter (INDEPENDENT_AMBULATORY_CARE_PROVIDER_SITE_OTHER): Payer: Self-pay | Admitting: *Deleted

## 2016-06-05 ENCOUNTER — Encounter: Payer: Self-pay | Admitting: Family

## 2016-06-05 VITALS — BP 140/80 | HR 62 | Temp 98.0°F | Ht 64.0 in | Wt 139.0 lb

## 2016-06-05 DIAGNOSIS — E785 Hyperlipidemia, unspecified: Secondary | ICD-10-CM

## 2016-06-05 DIAGNOSIS — G4700 Insomnia, unspecified: Secondary | ICD-10-CM

## 2016-06-05 DIAGNOSIS — M5136 Other intervertebral disc degeneration, lumbar region: Secondary | ICD-10-CM

## 2016-06-05 DIAGNOSIS — F32A Depression, unspecified: Secondary | ICD-10-CM

## 2016-06-05 DIAGNOSIS — Z1211 Encounter for screening for malignant neoplasm of colon: Secondary | ICD-10-CM

## 2016-06-05 DIAGNOSIS — F411 Generalized anxiety disorder: Secondary | ICD-10-CM

## 2016-06-05 DIAGNOSIS — I10 Essential (primary) hypertension: Secondary | ICD-10-CM

## 2016-06-05 DIAGNOSIS — F329 Major depressive disorder, single episode, unspecified: Secondary | ICD-10-CM

## 2016-06-05 DIAGNOSIS — Z1239 Encounter for other screening for malignant neoplasm of breast: Secondary | ICD-10-CM

## 2016-06-05 DIAGNOSIS — Z0000 Encounter for general adult medical examination without abnormal findings: Secondary | ICD-10-CM

## 2016-06-05 DIAGNOSIS — E039 Hypothyroidism, unspecified: Secondary | ICD-10-CM

## 2016-06-05 DIAGNOSIS — J441 Chronic obstructive pulmonary disease with (acute) exacerbation: Secondary | ICD-10-CM

## 2016-06-05 DIAGNOSIS — F17200 Nicotine dependence, unspecified, uncomplicated: Secondary | ICD-10-CM | POA: Insufficient documentation

## 2016-06-05 DIAGNOSIS — M51369 Other intervertebral disc degeneration, lumbar region without mention of lumbar back pain or lower extremity pain: Secondary | ICD-10-CM

## 2016-06-05 DIAGNOSIS — Z01419 Encounter for gynecological examination (general) (routine) without abnormal findings: Secondary | ICD-10-CM

## 2016-06-05 DIAGNOSIS — K5900 Constipation, unspecified: Secondary | ICD-10-CM | POA: Insufficient documentation

## 2016-06-05 LAB — URINALYSIS, COMPLETE
BILIRUBIN UA: NEGATIVE
Glucose, UA: NEGATIVE
Ketones, UA: NEGATIVE
NITRITE UA: NEGATIVE
Protein, UA: NEGATIVE
RBC UA: NEGATIVE
SPEC GRAV UA: 1.025 (ref 1.005–1.030)
Urobilinogen, Ur: 0.2 mg/dL (ref 0.2–1.0)
pH, UA: 5.5 (ref 5.0–7.5)

## 2016-06-05 LAB — MICROSCOPIC EXAMINATION: RBC MICROSCOPIC, UA: NONE SEEN /HPF (ref 0–?)

## 2016-06-05 MED ORDER — VARENICLINE TARTRATE 1 MG PO TABS: 1.0000 mg | ORAL_TABLET | Freq: Two times a day (BID) | ORAL | Status: DC

## 2016-06-05 MED ORDER — LINACLOTIDE 72 MCG PO CAPS: 72.0000 ug | ORAL_CAPSULE | Freq: Every day | ORAL | Status: DC

## 2016-06-05 MED ORDER — VORTIOXETINE HBR 20 MG PO TABS: 20.0000 mg | ORAL_TABLET | Freq: Every day | ORAL | Status: DC

## 2016-06-05 MED ORDER — VARENICLINE TARTRATE 0.5 MG X 11 & 1 MG X 42 PO MISC: ORAL | Status: DC

## 2016-06-05 NOTE — Patient Instructions (Signed)
Major Depressive Disorder Major depressive disorder is a mental illness. It also may be called clinical depression or unipolar depression. Major depressive disorder usually causes feelings of sadness, hopelessness, or helplessness. Some people with this disorder do not feel particularly sad but lose interest in doing things they used to enjoy (anhedonia). Major depressive disorder also can cause physical symptoms. It can interfere with work, school, relationships, and other normal everyday activities. The disorder varies in severity but is longer lasting and more serious than the sadness we all feel from time to time in our lives. Major depressive disorder often is triggered by stressful life events or major life changes. Examples of these triggers include divorce, loss of your job or home, a move, and the death of a family member or close friend. Sometimes this disorder occurs for no obvious reason at all. People who have family members with major depressive disorder or bipolar disorder are at higher risk for developing this disorder, with or without life stressors. Major depressive disorder can occur at any age. It may occur just once in your life (single episode major depressive disorder). It may occur multiple times (recurrent major depressive disorder). SYMPTOMS People with major depressive disorder have either anhedonia or depressed mood on nearly a daily basis for at least 2 weeks or longer. Symptoms of depressed mood include:  Feelings of sadness (blue or down in the dumps) or emptiness.  Feelings of hopelessness or helplessness.  Tearfulness or episodes of crying (may be observed by others).  Irritability (children and adolescents). In addition to depressed mood or anhedonia or both, people with this disorder have at least four of the following symptoms:  Difficulty sleeping or sleeping too much.   Significant change (increase or decrease) in appetite or weight.   Lack of energy or  motivation.  Feelings of guilt and worthlessness.   Difficulty concentrating, remembering, or making decisions.  Unusually slow movement (psychomotor retardation) or restlessness (as observed by others).   Recurrent wishes for death, recurrent thoughts of self-harm (suicide), or a suicide attempt. People with major depressive disorder commonly have persistent negative thoughts about themselves, other people, and the world. People with severe major depressive disorder may experiencedistorted beliefs or perceptions about the world (psychotic delusions). They also may see or hear things that are not real (psychotic hallucinations). DIAGNOSIS Major depressive disorder is diagnosed through an assessment by your health care provider. Your health care provider will ask aboutaspects of your daily life, such as mood,sleep, and appetite, to see if you have the diagnostic symptoms of major depressive disorder. Your health care provider may ask about your medical history and use of alcohol or drugs, including prescription medicines. Your health care provider also may do a physical exam and blood work. This is because certain medical conditions and the use of certain substances can cause major depressive disorder-like symptoms (secondary depression). Your health care provider also may refer you to a mental health specialist for further evaluation and treatment. TREATMENT It is important to recognize the symptoms of major depressive disorder and seek treatment. The following treatments can be prescribed for this disorder:   Medicine. Antidepressant medicines usually are prescribed. Antidepressant medicines are thought to correct chemical imbalances in the brain that are commonly associated with major depressive disorder. Other types of medicine may be added if the symptoms do not respond to antidepressant medicines alone or if psychotic delusions or hallucinations occur.  Talk therapy. Talk therapy can be  helpful in treating major depressive disorder by providing   support, education, and guidance. Certain types of talk therapy also can help with negative thinking (cognitive behavioral therapy) and with relationship issues that trigger this disorder (interpersonal therapy). A mental health specialist can help determine which treatment is best for you. Most people with major depressive disorder do well with a combination of medicine and talk therapy. Treatments involving electrical stimulation of the brain can be used in situations with extremely severe symptoms or when medicine and talk therapy do not work over time. These treatments include electroconvulsive therapy, transcranial magnetic stimulation, and vagal nerve stimulation.   This information is not intended to replace advice given to you by your health care provider. Make sure you discuss any questions you have with your health care provider.   Document Released: 02/28/2013 Document Revised: 11/24/2014 Document Reviewed: 02/28/2013 Elsevier Interactive Patient Education 2016 [employer]  

## 2016-06-05 NOTE — Progress Notes (Signed)
Subjective:    Patient ID: Kendra Michael, female    DOB: 1966/01/15, 50 y.o.   MRN: [id number]  Pt presents to the office today for chronic follow up. Pt see's Pain management every 2 months for chronic back pain. Gynecologic Exam The patient's pertinent negatives include no genital odor or vaginal discharge. This is a chronic problem. Associated symptoms include back pain and constipation. Pertinent negatives include no diarrhea or headaches.  Thyroid Problem Presents for follow-up visit. Symptoms include anxiety, constipation, depressed mood, fatigue and hoarse voice. Patient reports no diarrhea, nail problem, palpitations, tremors or visual change. The symptoms have been improving. Past treatments include levothyroxine. The treatment provided moderate relief. There is no history of heart failure.  Hypertension This is a chronic problem. The current episode started more than 1 year ago. The problem has been resolved since onset. The problem is controlled. Associated symptoms include anxiety and malaise/fatigue. Pertinent negatives include no headaches, palpitations, peripheral edema or shortness of breath. Risk factors for coronary artery disease include dyslipidemia, obesity, sedentary lifestyle and smoking/tobacco exposure. Past treatments include ACE inhibitors. Hypertensive end-organ damage includes a thyroid problem. There is no history of kidney disease, CAD/MI, CVA or heart failure.  Anxiety Presents for follow-up visit. Onset was 1 to 5 years ago. The problem has been waxing and waning. Symptoms include depressed mood, excessive worry, insomnia, irritability and nervous/anxious behavior. Patient reports no palpitations or shortness of breath. Symptoms occur constantly. The severity of symptoms is moderate. The symptoms are aggravated by family issues. The quality of sleep is non-restorative.   Her past medical history is significant for anxiety/panic attacks and depression.   Insomnia Primary symptoms: difficulty falling asleep, frequent awakening, malaise/fatigue.  The current episode started more than one year. The onset quality is gradual. The problem has been waxing and waning since onset. Past treatments include medication.  Back Pain This is a chronic problem. The current episode started more than 1 year ago. The problem occurs constantly. The problem is unchanged. The pain is present in the lumbar spine. The pain does not radiate. The pain is at a severity of 3/10. The pain is moderate. Pertinent negatives include no bladder incontinence, bowel incontinence or headaches. She has tried muscle relaxant, bed rest and analgesics for the symptoms. The treatment provided moderate relief.  Constipation This is a chronic problem. The current episode started more than 1 year ago. The problem has been waxing and waning since onset. Her stool frequency is 2 to 3 times per week. Associated symptoms include back pain and bloating. Pertinent negatives include no diarrhea. Risk factors include change in medication usage/dosage. She has tried laxatives for the symptoms. The treatment provided mild relief.  COPD Pt currently taking Symbicort BID. PT states this seems to be working. PT continues to smokes 4-5 cigarettes a day. PT has tried chantix in the past and stopped for a few weeks and would like to try it again.      Review of Systems  Constitutional: Positive for malaise/fatigue, irritability and fatigue.  HENT: Positive for hoarse voice.   Eyes: Negative.   Respiratory: Negative.  Negative for shortness of breath.   Cardiovascular: Negative.  Negative for palpitations.  Gastrointestinal: Positive for constipation and bloating. Negative for diarrhea and bowel incontinence.  Endocrine: Negative.   Genitourinary: Negative.  Negative for bladder incontinence and vaginal discharge.  Musculoskeletal: Positive for back pain.  Neurological: Negative.  Negative for tremors  and headaches.  Hematological: Negative.   Psychiatric/Behavioral: The patient  is nervous/anxious and has insomnia.   All other systems reviewed and are negative.      Objective:   Physical Exam  Constitutional: She is oriented to person, place, and time. She appears well-developed and well-nourished. No distress.  Pt tearful and crying during exam- Pt going through family issues with son at this time  HENT:  Head: Normocephalic and atraumatic.  Eyes: Pupils are equal, round, and reactive to light.  Neck: Normal range of motion. Neck supple. No thyromegaly present.  Cardiovascular: Normal rate, regular rhythm, normal heart sounds and intact distal pulses.   No murmur heard. Pulmonary/Chest: Effort normal and breath sounds normal. No respiratory distress. She has no wheezes. Right breast exhibits no inverted nipple, no mass, no nipple discharge, no skin change and no tenderness. Left breast exhibits no inverted nipple, no mass, no nipple discharge, no skin change and no tenderness. Breasts are symmetrical.  Abdominal: Soft. Bowel sounds are normal. She exhibits no distension. There is no tenderness.  Genitourinary: Vagina normal.  Bimanual exam- no adnexal masses or tenderness, ovaries nonpalpable   Cervix not present- No discharge   Musculoskeletal: Normal range of motion. She exhibits no edema or tenderness.  Neurological: She is alert and oriented to person, place, and time. She has normal reflexes. No cranial nerve deficit.  Skin: Skin is warm and dry.  Psychiatric: She has a normal mood and affect. Her behavior is normal. Judgment and thought content normal.  Vitals reviewed.     BP 140/80 mmHg  Pulse 62  Temp(Src) 98 F (36.7 C) (Oral)  Ht 5' 4"  (1.626 m)  Wt 139 lb (63.05 kg)  BMI 23.85 kg/m2     Assessment & Plan:  1. Encounter for routine gynecological examination - Urinalysis, Complete - CMP14+EGFR - Pap IG w/ reflex to HPV when ASC-U  2. HLD  (hyperlipidemia) - CMP14+EGFR - Lipid panel  3. Insomnia - CMP14+EGFR  4. Essential hypertension - CMP14+EGFR  5. GAD (generalized anxiety disorder) - CMP14+EGFR - vortioxetine HBr (TRINTELLIX) 20 MG TABS; Take 20 mg by mouth daily.  Dispense: 30 tablet; Refill: 3  6. DDD (degenerative disc disease), lumbar - CMP14+EGFR  7. Hypothyroidism, unspecified hypothyroidism type - CMP14+EGFR - Thyroid Panel With TSH  8. COPD exacerbation ([place]) - CMP14+EGFR  9. Constipation, unspecified constipation type -Pt started on linzess today - CMP14+EGFR - linaclotide (LINZESS) 72 MCG capsule; Take 1 capsule (72 mcg total) by mouth daily before breakfast.  Dispense: 30 capsule; Refill: 3  10. Current smoker -Chantix reordered today - CMP14+EGFR - varenicline (CHANTIX CONTINUING MONTH PAK) 1 MG tablet; Take 1 tablet (1 mg total) by mouth 2 (two) times daily.  Dispense: 56 tablet; Refill: 0 - varenicline (CHANTIX STARTING MONTH PAK) 0.5 MG X 11 & 1 MG X 42 tablet; Take one 0.5 mg tablet by mouth once daily for 3 days, then increase to one 0.5 mg tablet twice daily for 4 days, then increase to one 1 mg tablet twice daily.  Dispense: 53 tablet; Refill: 0  11. Tobacco dependence - CMP14+EGFR - varenicline (CHANTIX CONTINUING MONTH PAK) 1 MG tablet; Take 1 tablet (1 mg total) by mouth 2 (two) times daily.  Dispense: 56 tablet; Refill: 0 - varenicline (CHANTIX STARTING MONTH PAK) 0.5 MG X 11 & 1 MG X 42 tablet; Take one 0.5 mg tablet by mouth once daily for 3 days, then increase to one 0.5 mg tablet twice daily for 4 days, then increase to one 1 mg tablet twice daily.  Dispense: 53 tablet; Refill: 0  12. Laboratory tests ordered as part of a complete physical exam (CPE) - Anemia Profile B - CMP14+EGFR - Lipid panel - Hepatitis C Antibody - Thyroid Panel With TSH - VITAMIN D 25 Hydroxy (Vit-D Deficiency, Fractures) - Pap IG w/ reflex to HPV when ASC-U  13. Depression -PT started on trintellix  20 mg today. Lexapro 20 mg stopped - vortioxetine HBr (TRINTELLIX) 20 MG TABS; Take 20 mg by mouth daily.  Dispense: 30 tablet; Refill: 3  14. Colon cancer screening - Ambulatory referral to Gastroenterology  15. Breast cancer screening - [facility]   Continue all meds Labs pending Health Maintenance reviewed Diet and exercise encouraged RTO 4 week to recheck GAD and Depression  Evelina Dun, FNP

## 2016-06-06 LAB — CMP14+EGFR
A/G RATIO: 1.9 (ref 1.2–2.2)
ALBUMIN: 4.4 g/dL (ref 3.5–5.5)
ALT: 12 IU/L (ref 0–32)
AST: 18 IU/L (ref 0–40)
Alkaline Phosphatase: 74 IU/L (ref 39–117)
BILIRUBIN TOTAL: 0.3 mg/dL (ref 0.0–1.2)
BUN / CREAT RATIO: 9 (ref 9–23)
BUN: 7 mg/dL (ref 6–24)
CHLORIDE: 101 mmol/L (ref 96–106)
CO2: 25 mmol/L (ref 18–29)
Calcium: 9.5 mg/dL (ref 8.7–10.2)
Creatinine, Ser: 0.78 mg/dL (ref 0.57–1.00)
GFR calc Af Amer: 102 mL/min/{1.73_m2} (ref >59)
GFR calc non Af Amer: 88 mL/min/{1.73_m2} (ref >59)
Globulin, Total: 2.3 g/dL (ref 1.5–4.5)
Glucose: 84 mg/dL (ref 65–99)
POTASSIUM: 4.1 mmol/L (ref 3.5–5.2)
Sodium: 141 mmol/L (ref 134–144)
Total Protein: 6.7 g/dL (ref 6.0–8.5)

## 2016-06-06 LAB — ANEMIA PROFILE B
Basophils Absolute: 0 10*3/uL (ref 0.0–0.2)
Basos: 0 %
EOS (ABSOLUTE): 0 10*3/uL (ref 0.0–0.4)
Eos: 1 %
FERRITIN: 214 ng/mL — AB (ref 15–150)
FOLATE: 8 ng/mL (ref >3.0)
Hematocrit: 41.3 % (ref 34.0–46.6)
Hemoglobin: 13.8 g/dL (ref 11.1–15.9)
IRON: 89 ug/dL (ref 27–159)
Immature Grans (Abs): 0 10*3/uL (ref 0.0–0.1)
Immature Granulocytes: 0 %
Iron Saturation: 31 % (ref 15–55)
LYMPHS ABS: 2.7 10*3/uL (ref 0.7–3.1)
LYMPHS: 32 %
MCH: 31.4 pg (ref 26.6–33.0)
MCHC: 33.4 g/dL (ref 31.5–35.7)
MCV: 94 fL (ref 79–97)
MONOS ABS: 0.4 10*3/uL (ref 0.1–0.9)
Monocytes: 4 %
NEUTROS PCT: 63 %
Neutrophils Absolute: 5.4 10*3/uL (ref 1.4–7.0)
PLATELETS: 227 10*3/uL (ref 150–379)
RBC: 4.4 x10E6/uL (ref 3.77–5.28)
RDW: 13.2 % (ref 12.3–15.4)
RETIC CT PCT: 1.5 % (ref 0.6–2.6)
Total Iron Binding Capacity: 283 ug/dL (ref 250–450)
UIBC: 194 ug/dL (ref 131–425)
Vitamin B-12: 464 pg/mL (ref 211–946)
WBC: 8.5 10*3/uL (ref 3.4–10.8)

## 2016-06-06 LAB — THYROID PANEL WITH TSH
FREE THYROXINE INDEX: 2.5 (ref 1.2–4.9)
T3 UPTAKE RATIO: 29 % (ref 24–39)
T4 TOTAL: 8.5 ug/dL (ref 4.5–12.0)
TSH: 1.13 u[IU]/mL (ref 0.450–4.500)

## 2016-06-06 LAB — LIPID PANEL
Chol/HDL Ratio: 3.2 ratio units (ref 0.0–4.4)
Cholesterol, Total: 158 mg/dL (ref 100–199)
HDL: 49 mg/dL (ref >39)
LDL Calculated: 90 mg/dL (ref 0–99)
Triglycerides: 93 mg/dL (ref 0–149)
VLDL Cholesterol Cal: 19 mg/dL (ref 5–40)

## 2016-06-06 LAB — VITAMIN D 25 HYDROXY (VIT D DEFICIENCY, FRACTURES): Vit D, 25-Hydroxy: 37.2 ng/mL (ref 30.0–100.0)

## 2016-06-06 LAB — HEPATITIS C ANTIBODY

## 2016-06-06 NOTE — Telephone Encounter (Signed)
Refill called to [facility] for klonipin.

## 2016-06-06 NOTE — Telephone Encounter (Signed)
[facility] - CPE done 7/17.

## 2016-06-09 ENCOUNTER — Other Ambulatory Visit: Payer: Self-pay

## 2016-06-09 LAB — PAP IG W/ RFLX HPV ASCU: PAP Smear Comment: 0

## 2016-06-09 MED ORDER — PRAVASTATIN SODIUM 80 MG PO TABS: ORAL_TABLET | ORAL | 1 refills | Status: DC

## 2016-06-11 ENCOUNTER — Telehealth: Payer: Self-pay | Admitting: Family

## 2016-06-11 MED ORDER — LINACLOTIDE 145 MCG PO CAPS: 145.0000 ug | ORAL_CAPSULE | Freq: Every day | ORAL | 3 refills | Status: DC

## 2016-06-11 NOTE — Telephone Encounter (Signed)
Patient states that the linzess is not working at all. Please advise

## 2016-06-11 NOTE — Telephone Encounter (Signed)
Patient aware.

## 2016-06-11 NOTE — Telephone Encounter (Signed)
Linzess increased to 145 mcg daily. RX sent to pharmacy.

## 2016-06-13 ENCOUNTER — Other Ambulatory Visit: Payer: Self-pay | Admitting: Family

## 2016-06-17 ENCOUNTER — Telehealth: Payer: Self-pay

## 2016-06-17 MED ORDER — DULOXETINE HCL 60 MG PO CPEP: 60.0000 mg | ORAL_CAPSULE | Freq: Every day | ORAL | 1 refills | Status: DC

## 2016-06-17 NOTE — Telephone Encounter (Signed)
Patient aware.

## 2016-06-17 NOTE — Telephone Encounter (Signed)
Insurance denied Trintellix. Cymbalta Prescription sent to pharmacy

## 2016-06-17 NOTE — Telephone Encounter (Signed)
lmtcb

## 2016-06-30 ENCOUNTER — Encounter (INDEPENDENT_AMBULATORY_CARE_PROVIDER_SITE_OTHER): Payer: Self-pay | Admitting: *Deleted

## 2016-06-30 ENCOUNTER — Telehealth (INDEPENDENT_AMBULATORY_CARE_PROVIDER_SITE_OTHER): Payer: Self-pay | Admitting: *Deleted

## 2016-06-30 ENCOUNTER — Other Ambulatory Visit (INDEPENDENT_AMBULATORY_CARE_PROVIDER_SITE_OTHER): Payer: Self-pay | Admitting: *Deleted

## 2016-06-30 DIAGNOSIS — Z1211 Encounter for screening for malignant neoplasm of colon: Secondary | ICD-10-CM

## 2016-06-30 NOTE — Telephone Encounter (Signed)
Patient needs trilyte 

## 2016-07-01 ENCOUNTER — Other Ambulatory Visit: Payer: Self-pay | Admitting: Family

## 2016-07-01 ENCOUNTER — Other Ambulatory Visit: Payer: Self-pay | Admitting: Pediatrics

## 2016-07-01 DIAGNOSIS — F17200 Nicotine dependence, unspecified, uncomplicated: Secondary | ICD-10-CM

## 2016-07-01 MED ORDER — VARENICLINE TARTRATE 1 MG PO TABS: 1.0000 mg | ORAL_TABLET | Freq: Two times a day (BID) | ORAL | 0 refills | Status: DC

## 2016-07-02 MED ORDER — PEG 3350-KCL-NA BICARB-NACL 420 G PO SOLR: 4000.0000 mL | Freq: Once | ORAL | 0 refills | Status: AC

## 2016-07-03 ENCOUNTER — Other Ambulatory Visit: Payer: Self-pay | Admitting: Family Medicine

## 2016-07-07 ENCOUNTER — Ambulatory Visit: Payer: Medicaid Other | Admitting: Family

## 2016-07-09 ENCOUNTER — Telehealth (INDEPENDENT_AMBULATORY_CARE_PROVIDER_SITE_OTHER): Payer: Self-pay | Admitting: *Deleted

## 2016-07-09 NOTE — Telephone Encounter (Signed)
agree

## 2016-07-09 NOTE — Telephone Encounter (Signed)
Referring MD/PCP: [employer], np -- [place]   Procedure: tcs w/ propofol  Reason/Indication:  screening  Has patient had this procedure before?  Yes, in her 20's  If so, when, by whom and where?    Is there a family history of colon cancer?  no  Who?  What age when diagnosed?    Is patient diabetic?   no      Does patient have prosthetic heart valve or mechanical valve?  no  Do you have a pacemaker?  no  Has patient ever had endocarditis? no  Has patient had joint replacement within last 12 months?  no  Does patient tend to be constipated or take laxatives? yes  Does patient have a history of alcohol/drug use?  Years ago  Is patient on Coumadin, Plavix and/or Aspirin? yes  Medications: asa 81 mg daily, pravastatin 80 mg daily, fentyl patch 50 mcg 1 patch every 2 days, chantix 1 mg bid, percocet 10 mg tid, cymbicort 2 puffs bid, meloxicam 15 mg prn, lisinopril 5 mg daily, klonopin 1 mg daily, linzess 140 mcg daily, levothyroxine daily  Allergies: nkda  Medication Adjustment: asa 2 days  Procedure date & time: 08/08/16 at 930

## 2016-07-12 ENCOUNTER — Other Ambulatory Visit: Payer: Self-pay | Admitting: Family

## 2016-07-16 ENCOUNTER — Ambulatory Visit (INDEPENDENT_AMBULATORY_CARE_PROVIDER_SITE_OTHER): Payer: Medicaid Other | Admitting: Family

## 2016-07-16 ENCOUNTER — Encounter: Payer: Self-pay | Admitting: Family

## 2016-07-16 VITALS — BP 141/94 | HR 82 | Temp 97.2°F | Ht 64.0 in | Wt 136.0 lb

## 2016-07-16 DIAGNOSIS — F329 Major depressive disorder, single episode, unspecified: Secondary | ICD-10-CM

## 2016-07-16 DIAGNOSIS — I10 Essential (primary) hypertension: Secondary | ICD-10-CM | POA: Diagnosis not present

## 2016-07-16 DIAGNOSIS — F17200 Nicotine dependence, unspecified, uncomplicated: Secondary | ICD-10-CM

## 2016-07-16 DIAGNOSIS — Z720 Tobacco use: Secondary | ICD-10-CM

## 2016-07-16 DIAGNOSIS — K5900 Constipation, unspecified: Secondary | ICD-10-CM

## 2016-07-16 DIAGNOSIS — F32A Depression, unspecified: Secondary | ICD-10-CM

## 2016-07-16 MED ORDER — LINACLOTIDE 290 MCG PO CAPS: 290.0000 ug | ORAL_CAPSULE | Freq: Every day | ORAL | 3 refills | Status: DC

## 2016-07-16 MED ORDER — LISINOPRIL 20 MG PO TABS: 20.0000 mg | ORAL_TABLET | Freq: Every day | ORAL | 3 refills | Status: DC

## 2016-07-16 MED ORDER — VARENICLINE TARTRATE 1 MG PO TABS: 1.0000 mg | ORAL_TABLET | Freq: Two times a day (BID) | ORAL | 3 refills | Status: DC

## 2016-07-16 NOTE — Patient Instructions (Signed)
Major Depressive Disorder Major depressive disorder is a mental illness. It also may be called clinical depression or unipolar depression. Major depressive disorder usually causes feelings of sadness, hopelessness, or helplessness. Some people with this disorder do not feel particularly sad but lose interest in doing things they used to enjoy (anhedonia). Major depressive disorder also can cause physical symptoms. It can interfere with work, school, relationships, and other normal everyday activities. The disorder varies in severity but is longer lasting and more serious than the sadness we all feel from time to time in our lives. Major depressive disorder often is triggered by stressful life events or major life changes. Examples of these triggers include divorce, loss of your job or home, a move, and the death of a family member or close friend. Sometimes this disorder occurs for no obvious reason at all. People who have family members with major depressive disorder or bipolar disorder are at higher risk for developing this disorder, with or without life stressors. Major depressive disorder can occur at any age. It may occur just once in your life (single episode major depressive disorder). It may occur multiple times (recurrent major depressive disorder). SYMPTOMS People with major depressive disorder have either anhedonia or depressed mood on nearly a daily basis for at least 2 weeks or longer. Symptoms of depressed mood include:  Feelings of sadness (blue or down in the dumps) or emptiness.  Feelings of hopelessness or helplessness.  Tearfulness or episodes of crying (may be observed by others).  Irritability (children and adolescents). In addition to depressed mood or anhedonia or both, people with this disorder have at least four of the following symptoms:  Difficulty sleeping or sleeping too much.   Significant change (increase or decrease) in appetite or weight.   Lack of energy or  motivation.  Feelings of guilt and worthlessness.   Difficulty concentrating, remembering, or making decisions.  Unusually slow movement (psychomotor retardation) or restlessness (as observed by others).   Recurrent wishes for death, recurrent thoughts of self-harm (suicide), or a suicide attempt. People with major depressive disorder commonly have persistent negative thoughts about themselves, other people, and the world. People with severe major depressive disorder may experiencedistorted beliefs or perceptions about the world (psychotic delusions). They also may see or hear things that are not real (psychotic hallucinations). DIAGNOSIS Major depressive disorder is diagnosed through an assessment by your health care provider. Your health care provider will ask aboutaspects of your daily life, such as mood,sleep, and appetite, to see if you have the diagnostic symptoms of major depressive disorder. Your health care provider may ask about your medical history and use of alcohol or drugs, including prescription medicines. Your health care provider also may do a physical exam and blood work. This is because certain medical conditions and the use of certain substances can cause major depressive disorder-like symptoms (secondary depression). Your health care provider also may refer you to a mental health specialist for further evaluation and treatment. TREATMENT It is important to recognize the symptoms of major depressive disorder and seek treatment. The following treatments can be prescribed for this disorder:   Medicine. Antidepressant medicines usually are prescribed. Antidepressant medicines are thought to correct chemical imbalances in the brain that are commonly associated with major depressive disorder. Other types of medicine may be added if the symptoms do not respond to antidepressant medicines alone or if psychotic delusions or hallucinations occur.  Talk therapy. Talk therapy can be  helpful in treating major depressive disorder by providing   support, education, and guidance. Certain types of talk therapy also can help with negative thinking (cognitive behavioral therapy) and with relationship issues that trigger this disorder (interpersonal therapy). A mental health specialist can help determine which treatment is best for you. Most people with major depressive disorder do well with a combination of medicine and talk therapy. Treatments involving electrical stimulation of the brain can be used in situations with extremely severe symptoms or when medicine and talk therapy do not work over time. These treatments include electroconvulsive therapy, transcranial magnetic stimulation, and vagal nerve stimulation.   This information is not intended to replace advice given to you by your health care provider. Make sure you discuss any questions you have with your health care provider.   Document Released: 02/28/2013 Document Revised: 11/24/2014 Document Reviewed: 02/28/2013 Elsevier Interactive Patient Education 2016 [employer]  

## 2016-07-16 NOTE — Progress Notes (Signed)
Subjective:    Patient ID: Kendra Michael, female    DOB: June 26, 1966, 50 y.o.   MRN: [id number]  Pt presents to the office today to recheck depression. PT currently taking Cymbalta 60 mg, Lexapro 20 mg daily and klonopin 1 mg daily as needed. Pt states she continues to feel depressed. Pt states she was on chantix and was doing well.  Depression       The patient presents with depression.  This is a chronic problem.  The current episode started more than 1 year ago.   The onset quality is gradual.   The problem occurs constantly.  The problem has been waxing and waning since onset.  Associated symptoms include irritable, decreased interest, appetite change and sad.  Associated symptoms include no helplessness, no hopelessness and no suicidal ideas.  Past treatments include SSRIs - Selective serotonin reuptake inhibitors.  Compliance with treatment is good.  Previous treatment provided mild relief.  Past medical history includes chronic pain, anxiety and depression.   Hypertension  This is a chronic problem. The current episode started more than 1 year ago. The problem has been waxing and waning since onset. The problem is uncontrolled. Associated symptoms include anxiety and shortness of breath. Pertinent negatives include no peripheral edema. Risk factors for coronary artery disease include smoking/tobacco exposure and sedentary lifestyle. Past treatments include ACE inhibitors. The current treatment provides mild improvement. There is no history of kidney disease, CVA or heart failure.  Constipation  This is a chronic problem. The current episode started more than 1 year ago. The problem has been waxing and waning since onset. Her stool frequency is 2 to 3 times per week. She exercises regularly. Pertinent negatives include no fecal incontinence. Risk factors include recent illness. She has tried laxatives and diet changes for the symptoms. The treatment provided mild relief.      Review of Systems    Constitutional: Positive for appetite change.  Respiratory: Positive for shortness of breath.   Gastrointestinal: Positive for constipation.  Psychiatric/Behavioral: Positive for depression. Negative for suicidal ideas.  All other systems reviewed and are negative.      Objective:   Physical Exam  Constitutional: She is oriented to person, place, and time. She appears well-developed and well-nourished. She is irritable. No distress.  HENT:  Head: Normocephalic and atraumatic.  Cardiovascular: Normal rate, regular rhythm, normal heart sounds and intact distal pulses.   No murmur heard. Pulmonary/Chest: Effort normal and breath sounds normal. No respiratory distress. She has no wheezes.  Abdominal: Soft. Bowel sounds are normal. She exhibits no distension. There is no tenderness.  Musculoskeletal: Normal range of motion. She exhibits no edema or tenderness.  Neurological: She is alert and oriented to person, place, and time.  Skin: Skin is warm and dry.  Psychiatric: She has a normal mood and affect. Her behavior is normal. Judgment and thought content normal.  Vitals reviewed.        BP (!) 148/92   Pulse 91   Temp 97.2 F (36.2 C) (Oral)   Ht 5\' 4"  (1.626 m)   Wt 136 lb (61.7 kg)   BMI 23.34 kg/m   Assessment & Plan:  1. Current smoker -Smoking cessation discussed - varenicline (CHANTIX CONTINUING MONTH PAK) 1 MG tablet; Take 1 tablet (1 mg total) by mouth 2 (two) times daily.  Dispense: 60 tablet; Refill: 3  2. Tobacco dependence - varenicline (CHANTIX CONTINUING MONTH PAK) 1 MG tablet; Take 1 tablet (1 mg total) by mouth 2 (  two) times daily.  Dispense: 60 tablet; Refill: 3  3. Essential hypertension -Pt's lisinopril increased to 20 mg from 5 mg -Dash diet information given -Exercise encouraged - Stress Management  -Continue current meds -RTO in 2 weeks - lisinopril (PRINIVIL,ZESTRIL) 20 MG tablet; Take 1 tablet (20 mg total) by mouth daily.  Dispense: 90 tablet;  Refill: 3  4. Constipation, unspecified constipation type -Linzess increased to 290 mcg from 140 mcg -Force fluids -Increase activity - linaclotide (LINZESS) 290 MCG CAPS capsule; Take 1 capsule (290 mcg total) by mouth daily before breakfast.  Dispense: 30 capsule; Refill: 3  5. Depression -Continue lexapro and cymbalta  List of local psychologists she can contact   Evelina Dun, FNP

## 2016-07-31 ENCOUNTER — Encounter: Payer: Self-pay | Admitting: Family

## 2016-07-31 ENCOUNTER — Ambulatory Visit (INDEPENDENT_AMBULATORY_CARE_PROVIDER_SITE_OTHER): Payer: Medicaid Other | Admitting: Family

## 2016-07-31 VITALS — BP 120/76 | HR 67 | Temp 99.0°F | Ht 64.0 in | Wt 138.4 lb

## 2016-07-31 DIAGNOSIS — I10 Essential (primary) hypertension: Secondary | ICD-10-CM

## 2016-07-31 NOTE — Patient Instructions (Signed)
Hypertension Hypertension, commonly called high blood pressure, is when the force of blood pumping through your arteries is too strong. Your arteries are the blood vessels that carry blood from your heart throughout your body. A blood pressure reading consists of a higher number over a lower number, such as 110/72. The higher number (systolic) is the pressure inside your arteries when your heart pumps. The lower number (diastolic) is the pressure inside your arteries when your heart relaxes. Ideally you want your blood pressure below 120/80. Hypertension forces your heart to work harder to pump blood. Your arteries may become narrow or stiff. Having untreated or uncontrolled hypertension can cause heart attack, stroke, kidney disease, and other problems. RISK FACTORS Some risk factors for high blood pressure are controllable. Others are not.  Risk factors you cannot control include:   Race. You may be at higher risk if you are African American.  Age. Risk increases with age.  Gender. Men are at higher risk than women before age 45 years. After age 65, women are at higher risk than men. Risk factors you can control include:  Not getting enough exercise or physical activity.  Being overweight.  Getting too much fat, sugar, calories, or salt in your diet.  Drinking too much alcohol. SIGNS AND SYMPTOMS Hypertension does not usually cause signs or symptoms. Extremely high blood pressure (hypertensive crisis) may cause headache, anxiety, shortness of breath, and nosebleed. DIAGNOSIS To check if you have hypertension, your health care provider will measure your blood pressure while you are seated, with your arm held at the level of your heart. It should be measured at least twice using the same arm. Certain conditions can cause a difference in blood pressure between your right and left arms. A blood pressure reading that is higher than normal on one occasion does not mean that you need treatment. If  it is not clear whether you have high blood pressure, you may be asked to return on a different day to have your blood pressure checked again. Or, you may be asked to monitor your blood pressure at home for 1 or more weeks. TREATMENT Treating high blood pressure includes making lifestyle changes and possibly taking medicine. Living a healthy lifestyle can help lower high blood pressure. You may need to change some of your habits. Lifestyle changes may include:  Following the DASH diet. This diet is high in fruits, vegetables, and whole grains. It is low in salt, red meat, and added sugars.  Keep your sodium intake below 2,300 mg per day.  Getting at least 30-45 minutes of aerobic exercise at least 4 times per week.  Losing weight if necessary.  Not smoking.  Limiting alcoholic beverages.  Learning ways to reduce stress. Your health care provider may prescribe medicine if lifestyle changes are not enough to get your blood pressure under control, and if one of the following is true:  You are 18-59 years of age and your systolic blood pressure is above 140.  You are 60 years of age or older, and your systolic blood pressure is above 150.  Your diastolic blood pressure is above 90.  You have diabetes, and your systolic blood pressure is over 140 or your diastolic blood pressure is over 90.  You have kidney disease and your blood pressure is above 140/90.  You have heart disease and your blood pressure is above 140/90. Your personal target blood pressure may vary depending on your medical conditions, your age, and other factors. HOME CARE INSTRUCTIONS    Have your blood pressure rechecked as directed by your health care provider.   Take medicines only as directed by your health care provider. Follow the directions carefully. Blood pressure medicines must be taken as prescribed. The medicine does not work as well when you skip doses. Skipping doses also puts you at risk for  problems.  Do not smoke.   Monitor your blood pressure at home as directed by your health care provider. SEEK MEDICAL CARE IF:   You think you are having a reaction to medicines taken.  You have recurrent headaches or feel dizzy.  You have swelling in your ankles.  You have trouble with your vision. SEEK IMMEDIATE MEDICAL CARE IF:  You develop a severe headache or confusion.  You have unusual weakness, numbness, or feel faint.  You have severe chest or abdominal pain.  You vomit repeatedly.  You have trouble breathing. MAKE SURE YOU:   Understand these instructions.  Will watch your condition.  Will get help right away if you are not doing well or get worse.   This information is not intended to replace advice given to you by your health care provider. Make sure you discuss any questions you have with your health care provider.   Document Released: 11/03/2005 Document Revised: 03/20/2015 Document Reviewed: 08/26/2013 Elsevier Interactive Patient Education 2016 [employer]  

## 2016-07-31 NOTE — Progress Notes (Signed)
   Subjective:    Patient ID: Kendra Michael, female    DOB: 04-25-1966, 50 y.o.   MRN: [id number]  Pt presents to the office today to recheck HTN. PT's BP is at goal today! Hypertension  This is a chronic problem. The current episode started more than 1 year ago. The problem has been resolved since onset. The problem is controlled. Pertinent negatives include no blurred vision, headaches, malaise/fatigue, palpitations, peripheral edema or shortness of breath. Past treatments include ACE inhibitors. The current treatment provides moderate improvement. There is no history of kidney disease, CAD/MI, CVA or heart failure.      Review of Systems  Constitutional: Negative for malaise/fatigue.  Eyes: Negative for blurred vision.  Respiratory: Negative for shortness of breath.   Cardiovascular: Negative for palpitations.  Neurological: Negative for headaches.  All other systems reviewed and are negative.      Objective:   Physical Exam  Constitutional: She is oriented to person, place, and time. She appears well-developed and well-nourished. No distress.  HENT:  Head: Normocephalic.  Cardiovascular: Normal rate, regular rhythm, normal heart sounds and intact distal pulses.   No murmur heard. Pulmonary/Chest: Effort normal and breath sounds normal. No respiratory distress. She has no wheezes.  Musculoskeletal: Normal range of motion. She exhibits no edema or tenderness.  Neurological: She is alert and oriented to person, place, and time.  Skin: Skin is warm and dry.  Psychiatric: She has a normal mood and affect. Her behavior is normal. Judgment and thought content normal.  Vitals reviewed.     BP 120/76   Pulse 67   Temp 99 F (37.2 C) (Oral)   Ht '5\' 4"'$  (1.626 m)   Wt 138 lb 6.4 oz (62.8 kg)   BMI 23.76 kg/m      Assessment & Plan:  1. Essential hypertension -Dash diet information given -Exercise encouraged - Stress Management  -Continue current meds -RTO in 35month -  BMP8+EGFR  CEvelina Dun FNP

## 2016-07-31 NOTE — Patient Instructions (Signed)
Kendra Michael  07/31/2016     @PREFPERIOPPHARMACY @   Your procedure is scheduled on 08/08/2016.  Report to [facility] at 8:00 A.M.  Call this number if you have problems the morning of surgery:  [phone]   Remember:  Do not eat food or drink liquids after midnight.  Take these medicines the morning of surgery with A SIP OF WATER Symbicort inhaler, Klonopin, Lexapro, Fentanyl patch, Synthroid, Lisinopril  Mobic, Oxycodone, Chantix   Do not wear jewelry, make-up or nail polish.  Do not wear lotions, powders, or perfumes, or deoderant.  Do not shave 48 hours prior to surgery.  Men may shave face and neck.  Do not bring valuables to the hospital.  [facility] is not responsible for any belongings or valuables.  Contacts, dentures or bridgework may not be worn into surgery.  Leave your suitcase in the car.  After surgery it may be brought to your room.  For patients admitted to the hospital, discharge time will be determined by your treatment team.  Patients discharged the day of surgery will not be allowed to drive home.    Please read over the following fact sheets that you were given. Anesthesia Post-op Instructions     PATIENT INSTRUCTIONS POST-ANESTHESIA  IMMEDIATELY FOLLOWING SURGERY:  Do not drive or operate machinery for the first twenty four hours after surgery.  Do not make any important decisions for twenty four hours after surgery or while taking narcotic pain medications or sedatives.  If you develop intractable nausea and vomiting or a severe headache please notify your doctor immediately.  FOLLOW-UP:  Please make an appointment with your surgeon as instructed. You do not need to follow up with anesthesia unless specifically instructed to do so.  WOUND CARE INSTRUCTIONS (if applicable):  Keep a dry clean dressing on the anesthesia/puncture wound site if there is drainage.  Once the wound has quit draining you may leave it open to air.  Generally you should  leave the bandage intact for twenty four hours unless there is drainage.  If the epidural site drains for more than 36-48 hours please call the anesthesia department.  QUESTIONS?:  Please feel free to call your physician or the hospital operator if you have any questions, and they will be happy to assist you.      Colonoscopy A colonoscopy is an exam to look at the entire large intestine (colon). This exam can help find problems such as tumors, polyps, inflammation, and areas of bleeding. The exam takes about 1 hour.  LET [facility] CARE PROVIDER KNOW ABOUT:   Any allergies you have.  All medicines you are taking, including vitamins, herbs, eye drops, creams, and over-the-counter medicines.  Previous problems you or members of your family have had with the use of anesthetics.  Any blood disorders you have.  Previous surgeries you have had.  Medical conditions you have. RISKS AND COMPLICATIONS  Generally, this is a safe procedure. However, as with any procedure, complications can occur. Possible complications include:  Bleeding.  Tearing or rupture of the colon wall.  Reaction to medicines given during the exam.  Infection (rare). BEFORE THE PROCEDURE   Ask your health care provider about changing or stopping your regular medicines.  You may be prescribed an oral bowel prep. This involves drinking a large amount of medicated liquid, starting the day before your procedure. The liquid will cause you to have multiple loose stools until your stool is almost clear or light green. This  cleans out your colon in preparation for the procedure.  Do not eat or drink anything else once you have started the bowel prep, unless your health care provider tells you it is safe to do so.  Arrange for someone to drive you home after the procedure. PROCEDURE   You will be given medicine to help you relax (sedative).  You will lie on your side with your knees bent.  A long, flexible tube with  a light and camera on the end (colonoscope) will be inserted through the rectum and into the colon. The camera sends video back to a computer screen as it moves through the colon. The colonoscope also releases carbon dioxide gas to inflate the colon. This helps your health care provider see the area better.  During the exam, your health care provider may take a small tissue sample (biopsy) to be examined under a microscope if any abnormalities are found.  The exam is finished when the entire colon has been viewed. AFTER THE PROCEDURE   Do not drive for 24 hours after the exam.  You may have a small amount of blood in your stool.  You may pass moderate amounts of gas and have mild abdominal cramping or bloating. This is caused by the gas used to inflate your colon during the exam.  Ask when your test results will be ready and how you will get your results. Make sure you get your test results.   This information is not intended to replace advice given to you by your health care provider. Make sure you discuss any questions you have with your health care provider.   Document Released: 10/31/2000 Document Revised: 08/24/2013 Document Reviewed: 07/11/2013 Elsevier Interactive Patient Education [employer].

## 2016-08-01 ENCOUNTER — Encounter (HOSPITAL_COMMUNITY): Payer: Self-pay

## 2016-08-01 ENCOUNTER — Other Ambulatory Visit: Payer: Self-pay

## 2016-08-01 ENCOUNTER — Telehealth: Payer: Self-pay | Admitting: Family

## 2016-08-01 ENCOUNTER — Encounter (HOSPITAL_COMMUNITY)
Admission: RE | Admit: 2016-08-01 | Discharge: 2016-08-01 | Disposition: A | Discharge status: Home / Self Care | Payer: Medicaid Other | Source: Ambulatory Visit | Attending: Internal Medicine | Admitting: Internal Medicine

## 2016-08-01 DIAGNOSIS — Z01818 Encounter for other preprocedural examination: Secondary | ICD-10-CM | POA: Diagnosis not present

## 2016-08-01 DIAGNOSIS — Z01810 Encounter for preprocedural cardiovascular examination: Secondary | ICD-10-CM | POA: Diagnosis not present

## 2016-08-01 DIAGNOSIS — Z01812 Encounter for preprocedural laboratory examination: Secondary | ICD-10-CM | POA: Diagnosis not present

## 2016-08-01 HISTORY — DX: Gastro-esophageal reflux disease without esophagitis: K21.9

## 2016-08-01 HISTORY — DX: Malignant (primary) neoplasm, unspecified: C80.1

## 2016-08-01 HISTORY — DX: Anemia, unspecified: D64.9

## 2016-08-01 HISTORY — DX: Chronic obstructive pulmonary disease, unspecified: J44.9

## 2016-08-01 HISTORY — DX: Fibromyalgia: M79.7

## 2016-08-01 HISTORY — DX: Emphysema, unspecified: J43.9

## 2016-08-01 LAB — CBC
HCT: 41.9 % (ref 36.0–46.0)
Hemoglobin: 14.4 g/dL (ref 12.0–15.0)
MCH: 31.9 pg (ref 26.0–34.0)
MCHC: 34.4 g/dL (ref 30.0–36.0)
MCV: 92.9 fL (ref 78.0–100.0)
PLATELETS: 147 10*3/uL — AB (ref 150–400)
RBC: 4.51 MIL/uL (ref 3.87–5.11)
RDW: 11.9 % (ref 11.5–15.5)
WBC: 7.7 10*3/uL (ref 4.0–10.5)

## 2016-08-01 LAB — BMP8+EGFR
BUN / CREAT RATIO: 8 — AB (ref 9–23)
BUN: 6 mg/dL (ref 6–24)
CO2: 28 mmol/L (ref 18–29)
CREATININE: 0.73 mg/dL (ref 0.57–1.00)
Calcium: 9.6 mg/dL (ref 8.7–10.2)
Chloride: 101 mmol/L (ref 96–106)
GFR calc Af Amer: 110 mL/min/{1.73_m2} (ref >59)
GFR, EST NON AFRICAN AMERICAN: 95 mL/min/{1.73_m2} (ref >59)
GLUCOSE: 87 mg/dL (ref 65–99)
POTASSIUM: 4.3 mmol/L (ref 3.5–5.2)
SODIUM: 141 mmol/L (ref 134–144)

## 2016-08-08 ENCOUNTER — Encounter (HOSPITAL_COMMUNITY): Payer: Self-pay | Admitting: *Deleted

## 2016-08-08 ENCOUNTER — Ambulatory Visit (HOSPITAL_COMMUNITY)
Admission: RE | Admit: 2016-08-08 | Discharge: 2016-08-08 | Disposition: A | Discharge status: Home / Self Care | Payer: Medicaid Other | Source: Ambulatory Visit | Attending: Internal Medicine | Admitting: Internal Medicine

## 2016-08-08 ENCOUNTER — Encounter (HOSPITAL_COMMUNITY)
Admission: RE | Disposition: A | Discharge status: Home / Self Care | Payer: Self-pay | Source: Ambulatory Visit | Attending: Internal Medicine

## 2016-08-08 ENCOUNTER — Ambulatory Visit (HOSPITAL_COMMUNITY): Payer: Medicaid Other | Admitting: Anesthesiology

## 2016-08-08 DIAGNOSIS — J449 Chronic obstructive pulmonary disease, unspecified: Secondary | ICD-10-CM | POA: Diagnosis not present

## 2016-08-08 DIAGNOSIS — F419 Anxiety disorder, unspecified: Secondary | ICD-10-CM | POA: Insufficient documentation

## 2016-08-08 DIAGNOSIS — Z79899 Other long term (current) drug therapy: Secondary | ICD-10-CM | POA: Diagnosis not present

## 2016-08-08 DIAGNOSIS — G8929 Other chronic pain: Secondary | ICD-10-CM | POA: Insufficient documentation

## 2016-08-08 DIAGNOSIS — E039 Hypothyroidism, unspecified: Secondary | ICD-10-CM | POA: Insufficient documentation

## 2016-08-08 DIAGNOSIS — Z7982 Long term (current) use of aspirin: Secondary | ICD-10-CM | POA: Diagnosis not present

## 2016-08-08 DIAGNOSIS — Z79891 Long term (current) use of opiate analgesic: Secondary | ICD-10-CM | POA: Insufficient documentation

## 2016-08-08 DIAGNOSIS — M549 Dorsalgia, unspecified: Secondary | ICD-10-CM | POA: Diagnosis not present

## 2016-08-08 DIAGNOSIS — M1990 Unspecified osteoarthritis, unspecified site: Secondary | ICD-10-CM | POA: Insufficient documentation

## 2016-08-08 DIAGNOSIS — I10 Essential (primary) hypertension: Secondary | ICD-10-CM | POA: Insufficient documentation

## 2016-08-08 DIAGNOSIS — K6289 Other specified diseases of anus and rectum: Secondary | ICD-10-CM

## 2016-08-08 DIAGNOSIS — M797 Fibromyalgia: Secondary | ICD-10-CM | POA: Insufficient documentation

## 2016-08-08 DIAGNOSIS — K5909 Other constipation: Secondary | ICD-10-CM | POA: Diagnosis not present

## 2016-08-08 DIAGNOSIS — K219 Gastro-esophageal reflux disease without esophagitis: Secondary | ICD-10-CM | POA: Insufficient documentation

## 2016-08-08 DIAGNOSIS — Z1211 Encounter for screening for malignant neoplasm of colon: Secondary | ICD-10-CM | POA: Diagnosis present

## 2016-08-08 DIAGNOSIS — E785 Hyperlipidemia, unspecified: Secondary | ICD-10-CM | POA: Insufficient documentation

## 2016-08-08 DIAGNOSIS — K644 Residual hemorrhoidal skin tags: Secondary | ICD-10-CM | POA: Insufficient documentation

## 2016-08-08 DIAGNOSIS — Z7951 Long term (current) use of inhaled steroids: Secondary | ICD-10-CM | POA: Diagnosis not present

## 2016-08-08 DIAGNOSIS — F17210 Nicotine dependence, cigarettes, uncomplicated: Secondary | ICD-10-CM | POA: Diagnosis not present

## 2016-08-08 HISTORY — PX: COLONOSCOPY WITH PROPOFOL: SHX5780

## 2016-08-08 SURGERY — COLONOSCOPY WITH PROPOFOL
Anesthesia: Monitor Anesthesia Care

## 2016-08-08 MED ORDER — SODIUM CHLORIDE 0.9 % IV SOLN: INTRAVENOUS | Status: DC

## 2016-08-08 MED ORDER — FENTANYL CITRATE (PF) 100 MCG/2ML IJ SOLN
25.0000 ug | INTRAMUSCULAR | Status: AC | PRN
  Administered 2016-08-08 (×2): 25 ug via INTRAVENOUS

## 2016-08-08 MED ORDER — MIDAZOLAM HCL 5 MG/5ML IJ SOLN
INTRAMUSCULAR | Status: DC | PRN
  Administered 2016-08-08: 2 mg via INTRAVENOUS

## 2016-08-08 MED ORDER — MIDAZOLAM HCL 2 MG/2ML IJ SOLN
INTRAMUSCULAR | Status: AC
  Filled 2016-08-08: qty 2

## 2016-08-08 MED ORDER — HYDROMORPHONE HCL 1 MG/ML IJ SOLN: 0.2500 mg | INTRAMUSCULAR | Status: DC | PRN

## 2016-08-08 MED ORDER — LACTATED RINGERS IV SOLN
INTRAVENOUS | Status: DC
  Administered 2016-08-08: 09:00:00 via INTRAVENOUS

## 2016-08-08 MED ORDER — BENEFIBER DRINK MIX PO PACK
4.0000 g | PACK | Freq: Every day | ORAL | Status: DC
Start: 2016-08-08 — End: 2017-03-12

## 2016-08-08 MED ORDER — LIDOCAINE HCL (CARDIAC) 10 MG/ML IV SOLN
INTRAVENOUS | Status: DC | PRN
  Administered 2016-08-08: 50 mg via INTRAVENOUS

## 2016-08-08 MED ORDER — FENTANYL CITRATE (PF) 100 MCG/2ML IJ SOLN
INTRAMUSCULAR | Status: AC
  Filled 2016-08-08: qty 2

## 2016-08-08 MED ORDER — PROPOFOL 10 MG/ML IV BOLUS
INTRAVENOUS | Status: AC
Start: 2016-08-08 — End: 2016-08-08
  Filled 2016-08-08: qty 40

## 2016-08-08 MED ORDER — PROPOFOL 10 MG/ML IV BOLUS
INTRAVENOUS | Status: DC | PRN
  Administered 2016-08-08 (×2): 15 mg via INTRAVENOUS

## 2016-08-08 MED ORDER — MIDAZOLAM HCL 2 MG/2ML IJ SOLN
1.0000 mg | INTRAMUSCULAR | Status: DC | PRN
  Administered 2016-08-08 (×2): 2 mg via INTRAVENOUS
  Filled 2016-08-08 (×2): qty 2

## 2016-08-08 MED ORDER — PROPOFOL 10 MG/ML IV BOLUS
INTRAVENOUS | Status: AC
  Filled 2016-08-08: qty 20

## 2016-08-08 MED ORDER — PROPOFOL 500 MG/50ML IV EMUL
INTRAVENOUS | Status: DC | PRN
  Administered 2016-08-08: 100 ug/kg/min via INTRAVENOUS

## 2016-08-08 NOTE — Discharge Instructions (Signed)
Resume usual medications and high fiber diet. Benefiber 4 g by mouth daily at bedtime. No driving for 24 hours. Next screening exam in 10 years.  Colonoscopy, Care After Refer to this sheet in the next few weeks. These instructions provide you with information on caring for yourself after your procedure. Your health care provider may also give you more specific instructions. Your treatment has been planned according to current medical practices, but problems sometimes occur. Call your health care provider if you have any problems or questions after your procedure. WHAT TO EXPECT AFTER THE PROCEDURE  After your procedure, it is typical to have the following:  A small amount of blood in your stool.  Moderate amounts of gas and mild abdominal cramping or bloating. HOME CARE INSTRUCTIONS  Do not drive, operate machinery, or sign important documents for 24 hours.  You may shower and resume your regular physical activities, but move at a slower pace for the first 24 hours.  Take frequent rest periods for the first 24 hours.  Walk around or put a warm pack on your abdomen to help reduce abdominal cramping and bloating.  Drink enough fluids to keep your urine clear or pale yellow.  You may resume your normal diet as instructed by your health care provider. Avoid heavy or fried foods that are hard to digest.  Avoid drinking alcohol for 24 hours or as instructed by your health care provider.  Only take over-the-counter or prescription medicines as directed by your health care provider.  If a tissue sample (biopsy) was taken during your procedure:  Do not take aspirin or blood thinners for 7 days, or as instructed by your health care provider.  Do not drink alcohol for 7 days, or as instructed by your health care provider.  Eat soft foods for the first 24 hours. SEEK MEDICAL CARE IF: You have persistent spotting of blood in your stool 2-3 days after the procedure. SEEK IMMEDIATE MEDICAL  CARE IF:  You have more than a small spotting of blood in your stool.  You pass large blood clots in your stool.  Your abdomen is swollen (distended).  You have nausea or vomiting.  You have a fever.  You have increasing abdominal pain that is not relieved with medicine.   This information is not intended to replace advice given to you by your health care provider. Make sure you discuss any questions you have with your health care provider.   Document Released: 06/17/2004 Document Revised: 08/24/2013 Document Reviewed: 07/11/2013 Elsevier Interactive Patient Education 2016 Elsevier Inc.  High-Fiber Diet Fiber, also called dietary fiber, is a type of carbohydrate found in fruits, vegetables, whole grains, and beans. A high-fiber diet can have many health benefits. Your health care provider may recommend a high-fiber diet to help:  Prevent constipation. Fiber can make your bowel movements more regular.  Lower your cholesterol.  Relieve hemorrhoids, uncomplicated diverticulosis, or irritable bowel syndrome.  Prevent overeating as part of a weight-loss plan.  Prevent heart disease, type 2 diabetes, and certain cancers. WHAT IS MY PLAN? The recommended daily intake of fiber includes:  38 grams for men under age 30.  79 grams for men over age 41.  92 grams for women under age 58.  30 grams for women over age 52. You can get the recommended daily intake of dietary fiber by eating a variety of fruits, vegetables, grains, and beans. Your health care provider may also recommend a fiber supplement if it is not possible to  get enough fiber through your diet. WHAT DO I NEED TO KNOW ABOUT A HIGH-FIBER DIET?  Fiber supplements have not been widely studied for their effectiveness, so it is better to get fiber through food sources.  Always check the fiber content on thenutrition facts label of any prepackaged food. Look for foods that contain at least 5 grams of fiber per  serving.  Ask your dietitian if you have questions about specific foods that are related to your condition, especially if those foods are not listed in the following section.  Increase your daily fiber consumption gradually. Increasing your intake of dietary fiber too quickly may cause bloating, cramping, or gas.  Drink plenty of water. Water helps you to digest fiber. WHAT FOODS CAN I EAT? Grains Whole-grain breads. Multigrain cereal. Oats and oatmeal. Brown rice. Barley. Bulgur wheat. [place]. Bran muffins. Popcorn. Rye wafer crackers. Vegetables Sweet potatoes. Spinach. Kale. Artichokes. Cabbage. Broccoli. Green peas. Carrots. Squash. Fruits Berries. Pears. Apples. Oranges. Avocados. Prunes and raisins. Dried figs. Meats and Other Protein Sources [place], kidney, pinto, and soy beans. Split peas. Lentils. Nuts and seeds. Dairy Fiber-fortified yogurt. Beverages Fiber-fortified soy milk. Fiber-fortified orange juice. Other Fiber bars. The items listed above may not be a complete list of recommended foods or beverages. Contact your dietitian for more options. WHAT FOODS ARE NOT RECOMMENDED? Grains White bread. Pasta made with refined flour. White rice. Vegetables Fried potatoes. Canned vegetables. Well-cooked vegetables.  Fruits Fruit juice. Cooked, strained fruit. Meats and Other Protein Sources Fatty cuts of meat. Fried Sales executive or fried fish. Dairy Milk. Yogurt. Cream cheese. Sour cream. Beverages Soft drinks. Other Cakes and pastries. Butter and oils. The items listed above may not be a complete list of foods and beverages to avoid. Contact your dietitian for more information. WHAT ARE SOME TIPS FOR INCLUDING HIGH-FIBER FOODS IN MY DIET?  Eat a wide variety of high-fiber foods.  Make sure that half of all grains consumed each day are whole grains.  Replace breads and cereals made from refined flour or white flour with whole-grain breads and cereals.  Replace white rice  with brown rice, bulgur wheat, or millet.  Start the day with a breakfast that is high in fiber, such as a cereal that contains at least 5 grams of fiber per serving.  Use beans in place of meat in soups, salads, or pasta.  Eat high-fiber snacks, such as berries, raw vegetables, nuts, or popcorn.   This information is not intended to replace advice given to you by your health care provider. Make sure you discuss any questions you have with your health care provider.   Document Released: 11/03/2005 Document Revised: 11/24/2014 Document Reviewed: 04/18/2014 Elsevier Interactive Patient Education [employer].

## 2016-08-08 NOTE — Anesthesia Procedure Notes (Signed)
Procedure Name: MAC Date/Time: 08/08/2016 11:12 AM Performed by: Vista Deck Pre-anesthesia Checklist: Patient identified, Emergency Drugs available, Suction available, Timeout performed and Patient being monitored Patient Re-evaluated:Patient Re-evaluated prior to inductionOxygen Delivery Method: Non-rebreather mask

## 2016-08-08 NOTE — Transfer of Care (Signed)
Immediate Anesthesia Transfer of Care Note  Patient: Kendra Michael  Procedure(s) Performed: Procedure(s) with comments: COLONOSCOPY WITH PROPOFOL (N/A) - 930  Patient Location: PACU  Anesthesia Type:MAC  Level of Consciousness: awake and patient cooperative  Airway & Oxygen Therapy: Patient Spontanous Breathing and non-rebreather face mask  Post-op Assessment: Report given to RN and Post -op Vital signs reviewed and stable  Post vital signs: Reviewed and stable  Last Vitals:  Vitals:   08/08/16 1015 08/08/16 1020  BP:  (!) 143/83  Pulse:    Resp: (!) 25 (!) 21  Temp:      Last Pain:  Vitals:   08/08/16 0846  TempSrc: Oral      Patients Stated Pain Goal: 8 ([id number])  Complications: No apparent anesthesia complications

## 2016-08-08 NOTE — Anesthesia Preprocedure Evaluation (Signed)
Anesthesia Evaluation  Patient identified by MRN, date of birth, ID band Patient awake    Reviewed: Allergy & Precautions, NPO status , Patient's Chart, lab work & pertinent test results  Airway Mallampati: II  TM Distance: >3 FB     Dental  (+) Edentulous Upper, Edentulous Lower   Pulmonary COPD (emphysema), Current Smoker,    breath sounds clear to auscultation       Cardiovascular hypertension, Pt. on medications  Rhythm:Regular Rate:Normal     Neuro/Psych PSYCHIATRIC DISORDERS Anxiety    GI/Hepatic GERD  Medicated,  Endo/Other  Hypothyroidism   Renal/GU      Musculoskeletal  (+) Arthritis , Fibromyalgia -  Abdominal   Peds  Hematology   Anesthesia Other Findings Fentanyl patch 56mcg for back pain.  Reproductive/Obstetrics                             Anesthesia Physical Anesthesia Plan  ASA: III  Anesthesia Plan: MAC   Post-op Pain Management:    Induction: Intravenous  Airway Management Planned: Simple Face Mask  Additional Equipment:   Intra-op Plan:   Post-operative Plan:   Informed Consent: I have reviewed the patients History and Physical, chart, labs and discussed the procedure including the risks, benefits and alternatives for the proposed anesthesia with the patient or authorized representative who has indicated his/her understanding and acceptance.     Plan Discussed with:   Anesthesia Plan Comments:         Anesthesia Quick Evaluation

## 2016-08-08 NOTE — H&P (Signed)
Kendra Michael is an 50 y.o. female.   Chief Complaint: Patient is here for colonoscopy. HPI: Patient is 50 year old Caucasian female who is here for screening colonoscopy. She has chronic constipation secondary to medications and she takes Linzess. She denies abdominal pain or rectal bleeding. She says she had colonoscopy and she was in 64s rectal bleeding but was normal. She never bled again. She is on pain medication for chronic back pain. Family history is negative for CRC.  Past Medical History:  Diagnosis Date         . Anxiety   . Arthritis. Chronic low back pain.    . Cancer ([place])    cervical  . COPD (chronic obstructive pulmonary disease) ([place])   . Emphysema lung ([place])   . Fibromyalgia   . GERD (gastroesophageal reflux disease)   . Hyperlipidemia   . Hypertension   . Substance abuse   . Thyroid disease     Past Surgical History:  Procedure Laterality Date  . ABDOMINAL HYSTERECTOMY    . APPENDECTOMY    . CHOLECYSTECTOMY    . HAMMER TOE SURGERY Bilateral   . KNEE SURGERY Bilateral   . SHOULDER SURGERY Right     Family History  Problem Relation Age of Onset  . Stroke Mother   . Heart attack Father   . Heart attack Brother    Social History:  reports that she has been smoking Cigarettes.  She has a 30.00 pack-year smoking history. She has never used smokeless tobacco. She reports that she does not drink alcohol or use drugs.  Allergies: No Known Allergies  Medications Prior to Admission  Medication Sig Dispense Refill  . aspirin 81 MG tablet Take 81 mg by mouth daily.    . budesonide-formoterol (SYMBICORT) 160-4.5 MCG/ACT inhaler INHALE 2 PUFFS INTO THE LUNGS 2 TIMES DAILY. 10.2 g 5  . DULoxetine (CYMBALTA) 60 MG capsule Take 1 capsule (60 mg total) by mouth daily. 30 capsule 1  . escitalopram (LEXAPRO) 20 MG tablet TAKE 1 TABLET BY MOUTH EVERY DAY 90 tablet 0  . fentaNYL (DURAGESIC - DOSED MCG/HR) 50 MCG/HR Place 50 mcg onto the skin every 3 (three) days.    .  fluorometholone (FML) 0.1 % ophthalmic suspension Place 1 drop into both eyes 3 (three) times daily.  1  . levothyroxine (SYNTHROID, LEVOTHROID) 50 MCG tablet TAKE 1 TABLET (50 MCG TOTAL) BY MOUTH DAILY. 90 tablet 3  . linaclotide (LINZESS) 290 MCG CAPS capsule Take 1 capsule (290 mcg total) by mouth daily before breakfast. 30 capsule 3  . lisinopril (PRINIVIL,ZESTRIL) 20 MG tablet Take 1 tablet (20 mg total) by mouth daily. 90 tablet 3  . meloxicam (MOBIC) 15 MG tablet Take 15 mg by mouth daily as needed for pain.   5  . Omega-3 Fatty Acids (FISH OIL) 1000 MG CAPS Take 1 capsule by mouth daily.     Marland Kitchen oxyCODONE-acetaminophen (PERCOCET) 10-325 MG per tablet Take 1 tablet by mouth 3 (three) times daily.    . pravastatin (PRAVACHOL) 80 MG tablet TAKE 1 TABLET (80 MG TOTAL) BY MOUTH DAILY. 90 tablet 1  . tiZANidine (ZANAFLEX) 4 MG tablet Take 8 mg by mouth at bedtime.     . clonazePAM (KLONOPIN) 1 MG tablet TAKE 1 TABLET BY MOUTH DAILY AS NEEDED FOR ANXIETY (Patient not taking: Reported on 08/01/2016) 30 tablet 2    No results found for this or any previous visit (from the past 48 hour(s)). No results found.  ROS  Blood  pressure (!) 143/83, pulse 64, temperature 97.9 F (36.6 C), temperature source Oral, resp. rate (!) 21, SpO2 98 %. Physical Exam  Constitutional: She appears well-developed and well-nourished.  HENT:  Mouth/Throat: Oropharynx is clear and moist.  Eyes: Conjunctivae are normal. No scleral icterus.  Neck: No thyromegaly present.  Cardiovascular: Normal rate, regular rhythm and normal heart sounds.   No murmur heard. Respiratory: Effort normal and breath sounds normal.  GI:  Abdomen is symmetrical with low midline scar. It is soft and nontender without organomegaly or masses.  Lymphadenopathy:    She has no cervical adenopathy.     Assessment/Plan Average risk screening colonoscopy.  Hildred Laser, MD 08/08/2016, 11:05 AM

## 2016-08-08 NOTE — Op Note (Signed)
[facility] Patient Name: Kendra Michael Procedure Date: 08/08/2016 11:17 AM MRN: [id number] Date of Birth: [birth year] Attending MD: Hildred Laser , MD CSN: [id number] Age: 50 Admit Type: Outpatient Procedure:                Colonoscopy Indications:              Screening for colorectal malignant neoplasm Providers:                Hildred Laser, MD, Lurline Del, RN, [place]. [place],                            Merchant navy officer Referring MD:             Theador Hawthorne. Hawks, FNP Medicines:                Propofol per Anesthesia Complications:            No immediate complications. Estimated Blood Loss:     Estimated blood loss: none. Procedure:                Pre-Anesthesia Assessment:                           - Prior to the procedure, a History and Physical                            was performed, and patient medications and                            allergies were reviewed. The patient's tolerance of                            previous anesthesia was also reviewed. The risks                            and benefits of the procedure and the sedation                            options and risks were discussed with the patient.                            All questions were answered, and informed consent                            was obtained. Prior Anticoagulants: The patient                            last took aspirin 3 days prior to the procedure.                            ASA Grade Assessment: III - A patient with severe                            systemic disease. After reviewing the risks and  benefits, the patient was deemed in satisfactory                            condition to undergo the procedure.                           After obtaining informed consent, the colonoscope                            was passed under direct vision. Throughout the                            procedure, the patient's blood pressure, pulse, and   oxygen saturations were monitored continuously. The                            [id number]) scope was introduced through                            the anus and advanced to the the cecum, identified                            by appendiceal orifice and ileocecal valve. The                            colonoscopy was performed without difficulty. The                            patient tolerated the procedure well. The quality                            of the bowel preparation was adequate to identify                            polyps 6 mm and larger in size. The ileocecal                            valve, appendiceal orifice, and rectum were                            photographed. Scope In: 11:27:11 AM Scope Out: 11:47:48 AM Scope Withdrawal Time: 0 hours 12 minutes 17 seconds  Total Procedure Duration: 0 hours 20 minutes 37 seconds  Findings:      The perianal and digital rectal examinations were normal.      The colon (entire examined portion) appeared normal.      External hemorrhoids were found during retroflexion. The hemorrhoids       were small.      Anal papilla(e) were hypertrophied. Impression:               - The entire examined colon is normal.                           - External hemorrhoids.                           -  Anal papilla(e) were hypertrophied.                           - No specimens collected. Moderate Sedation:      Per Anesthesia Care Recommendation:           - Patient has a contact number available for                            emergencies. The signs and symptoms of potential                            delayed complications were discussed with the                            patient. Return to normal activities tomorrow.                            Written discharge instructions were provided to the                            patient.                           - High fiber diet today.                           - Continue present medications.                            - Use Benefiber 4 g by mouth bedtime PO daily.                           - Repeat colonoscopy in 10 years for screening                            purposes. Procedure Code(s):        --- Professional ---                           [phone], Colonoscopy, flexible; diagnostic, including                            collection of specimen(s) by brushing or washing,                            when performed (separate procedure) Diagnosis Code(s):        --- Professional ---                           Z12.11, Encounter for screening for malignant                            neoplasm of colon                           K64.4, Residual hemorrhoidal skin tags  K62.89, Other specified diseases of anus and rectum CPT copyright 2016 American Medical Association. All rights reserved. The codes documented in this report are preliminary and upon coder review may  be revised to meet current compliance requirements. Hildred Laser, MD Hildred Laser, MD 08/08/2016 11:54:26 AM This report has been signed electronically. Number of Addenda: 0

## 2016-08-08 NOTE — Anesthesia Postprocedure Evaluation (Signed)
Anesthesia Post Note  Patient: Kendra Michael  Procedure(s) Performed: Procedure(s) (LRB): COLONOSCOPY WITH PROPOFOL (N/A)  Patient location during evaluation: Short Stay Anesthesia Type: MAC Level of consciousness: awake and alert Pain management: pain level controlled Vital Signs Assessment: post-procedure vital signs reviewed and stable Respiratory status: spontaneous breathing Cardiovascular status: stable Anesthetic complications: no    Last Vitals:  Vitals:   08/08/16 1215 08/08/16 1217  BP:  118/74  Pulse: 71 67  Resp: (!) 24 18  Temp:  36.6 C    Last Pain:  Vitals:   08/08/16 1217  TempSrc: Oral                 [place]

## 2016-08-13 ENCOUNTER — Encounter (HOSPITAL_COMMUNITY): Payer: Self-pay | Admitting: Internal Medicine

## 2016-09-01 ENCOUNTER — Encounter: Payer: Self-pay | Admitting: Family

## 2016-09-01 ENCOUNTER — Ambulatory Visit (INDEPENDENT_AMBULATORY_CARE_PROVIDER_SITE_OTHER): Payer: Medicaid Other | Admitting: Family

## 2016-09-01 VITALS — BP 134/82 | HR 84 | Temp 98.4°F | Ht 64.0 in | Wt 134.4 lb

## 2016-09-01 DIAGNOSIS — Z888 Allergy status to other drugs, medicaments and biological substances status: Secondary | ICD-10-CM | POA: Diagnosis not present

## 2016-09-01 DIAGNOSIS — J301 Allergic rhinitis due to pollen: Secondary | ICD-10-CM | POA: Diagnosis not present

## 2016-09-01 DIAGNOSIS — J441 Chronic obstructive pulmonary disease with (acute) exacerbation: Secondary | ICD-10-CM

## 2016-09-01 DIAGNOSIS — Z09 Encounter for follow-up examination after completed treatment for conditions other than malignant neoplasm: Secondary | ICD-10-CM

## 2016-09-01 DIAGNOSIS — M5136 Other intervertebral disc degeneration, lumbar region: Secondary | ICD-10-CM | POA: Diagnosis not present

## 2016-09-01 MED ORDER — FLUTICASONE PROPIONATE 50 MCG/ACT NA SUSP: 2.0000 | Freq: Every day | NASAL | 6 refills | Status: DC

## 2016-09-01 NOTE — Progress Notes (Signed)
   Subjective:    Patient ID: Kendra Michael, female    DOB: 1966/09/12, 50 y.o.   MRN: [id number]  HPI Pt presents to the office today for hospital follow up. Pt went to [facility] on 08/28/16 for swelling of tongue, neck, and hands. Pt was told it was related to her lisinopril. Pt was told to stop lisinopril and was given rx of amlodipine 5 mg. PT reports since stopping her lisinopril the swelling has resolved and is doing well.   Pt is complaining of recurrent nonproductive cough that has been going on for several weeks. PT has taken doxycycline for 10 days and steroid with mild relief. Pt states she continues to smoke and uses her Symbicort BID.  Pt states she also would like to be referred to another pain clinic. PT states her pain clinic has shut down and was seeing someone in [facility]. Pt states her new provider and her does not agree on her medications.    Review of Systems  HENT: Positive for postnasal drip.   Respiratory: Positive for cough. Negative for shortness of breath.   Gastrointestinal: Negative.   Musculoskeletal: Positive for arthralgias and back pain.  Hematological: Negative.   All other systems reviewed and are negative.      Objective:   Physical Exam  Constitutional: She is oriented to person, place, and time. She appears well-developed and well-nourished. No distress.  HENT:  Head: Normocephalic and atraumatic.  Right Ear: External ear normal.  Left Ear: External ear normal.  Mouth/Throat: Oropharynx is clear and moist.  Nasal passage erythemas with mild swelling   Eyes: Pupils are equal, round, and reactive to light.  Neck: Normal range of motion. Neck supple. No thyromegaly present.  Cardiovascular: Normal rate, regular rhythm, normal heart sounds and intact distal pulses.   No murmur heard. Pulmonary/Chest: Effort normal and breath sounds normal. No respiratory distress. She has no wheezes.  Abdominal: Soft. Bowel sounds are normal. She exhibits no  distension. There is no tenderness.  Musculoskeletal: Normal range of motion. She exhibits no edema or tenderness.  Neurological: She is alert and oriented to person, place, and time. She has normal reflexes. No cranial nerve deficit.  Skin: Skin is warm and dry.  Psychiatric: She has a normal mood and affect. Her behavior is normal. Judgment and thought content normal.  Vitals reviewed.     BP 134/82   Pulse 84   Temp 98.4 F (36.9 C) (Oral)   Ht 5\' 4"  (1.626 m)   Wt 134 lb 6.4 oz (61 kg)   BMI 23.07 kg/m      Assessment & Plan:  1. COPD exacerbation ([place]) -Smoking cessation discussed -Continue Symbicort   2. Hospital discharge follow-up  3. Allergy to lisinopril -Added to allergy list  4. DDD (degenerative disc disease), lumbar -Referred to new pain clinic  - Ambulatory referral to Pain Clinic  5. Allergic rhinitis due to pollen, unspecified chronicity, unspecified seasonality -Take flonase at bedtime - fluticasone (FLONASE) 50 MCG/ACT nasal spray; Place 2 sprays into both nostrils daily.  Dispense: 16 g; Refill: [place], FNP

## 2016-09-01 NOTE — Patient Instructions (Signed)

## 2016-09-04 ENCOUNTER — Other Ambulatory Visit: Payer: Self-pay | Admitting: Family

## 2016-09-09 ENCOUNTER — Encounter: Payer: Medicaid Other | Admitting: *Deleted

## 2016-09-29 ENCOUNTER — Other Ambulatory Visit: Payer: Self-pay | Admitting: Family

## 2016-10-18 ENCOUNTER — Other Ambulatory Visit: Payer: Self-pay | Admitting: Family

## 2016-11-14 ENCOUNTER — Other Ambulatory Visit: Payer: Self-pay | Admitting: Family

## 2016-11-14 DIAGNOSIS — K5900 Constipation, unspecified: Secondary | ICD-10-CM

## 2016-12-01 ENCOUNTER — Other Ambulatory Visit: Payer: Self-pay | Admitting: Family

## 2016-12-07 ENCOUNTER — Other Ambulatory Visit: Payer: Self-pay | Admitting: Family

## 2016-12-09 ENCOUNTER — Other Ambulatory Visit: Payer: Self-pay | Admitting: Family

## 2016-12-30 ENCOUNTER — Other Ambulatory Visit: Payer: Self-pay | Admitting: Family

## 2017-01-03 ENCOUNTER — Other Ambulatory Visit: Payer: Self-pay | Admitting: Family

## 2017-01-12 ENCOUNTER — Other Ambulatory Visit: Payer: Self-pay | Admitting: Family

## 2017-01-29 ENCOUNTER — Other Ambulatory Visit: Payer: Self-pay | Admitting: Family

## 2017-02-09 ENCOUNTER — Other Ambulatory Visit: Payer: Self-pay | Admitting: Family

## 2017-02-24 ENCOUNTER — Other Ambulatory Visit: Payer: Self-pay | Admitting: Family

## 2017-02-24 DIAGNOSIS — K5900 Constipation, unspecified: Secondary | ICD-10-CM

## 2017-02-27 ENCOUNTER — Other Ambulatory Visit: Payer: Self-pay | Admitting: Family

## 2017-03-03 ENCOUNTER — Ambulatory Visit: Payer: Medicaid Other | Admitting: Family

## 2017-03-04 ENCOUNTER — Encounter: Payer: Self-pay | Admitting: Family

## 2017-03-09 ENCOUNTER — Ambulatory Visit: Payer: Medicaid Other | Admitting: Family

## 2017-03-10 ENCOUNTER — Encounter: Payer: Self-pay | Admitting: Family

## 2017-03-10 ENCOUNTER — Telehealth: Payer: Self-pay | Admitting: Family

## 2017-03-10 NOTE — Telephone Encounter (Signed)
Patient has a follow up appointment scheduled. 

## 2017-03-12 ENCOUNTER — Other Ambulatory Visit: Payer: Self-pay | Admitting: Family

## 2017-03-12 ENCOUNTER — Telehealth: Payer: Self-pay

## 2017-03-12 ENCOUNTER — Encounter: Payer: Self-pay | Admitting: Family

## 2017-03-12 ENCOUNTER — Ambulatory Visit (INDEPENDENT_AMBULATORY_CARE_PROVIDER_SITE_OTHER): Payer: Medicaid Other | Admitting: Family

## 2017-03-12 VITALS — BP 150/84 | HR 73 | Temp 97.7°F | Ht 64.0 in | Wt 159.8 lb

## 2017-03-12 DIAGNOSIS — E039 Hypothyroidism, unspecified: Secondary | ICD-10-CM

## 2017-03-12 DIAGNOSIS — K5900 Constipation, unspecified: Secondary | ICD-10-CM | POA: Diagnosis not present

## 2017-03-12 DIAGNOSIS — F17200 Nicotine dependence, unspecified, uncomplicated: Secondary | ICD-10-CM

## 2017-03-12 DIAGNOSIS — I10 Essential (primary) hypertension: Secondary | ICD-10-CM | POA: Diagnosis not present

## 2017-03-12 DIAGNOSIS — R5383 Other fatigue: Secondary | ICD-10-CM

## 2017-03-12 DIAGNOSIS — E785 Hyperlipidemia, unspecified: Secondary | ICD-10-CM | POA: Diagnosis not present

## 2017-03-12 DIAGNOSIS — J441 Chronic obstructive pulmonary disease with (acute) exacerbation: Secondary | ICD-10-CM | POA: Diagnosis not present

## 2017-03-12 DIAGNOSIS — F411 Generalized anxiety disorder: Secondary | ICD-10-CM

## 2017-03-12 DIAGNOSIS — R0683 Snoring: Secondary | ICD-10-CM

## 2017-03-12 DIAGNOSIS — G4700 Insomnia, unspecified: Secondary | ICD-10-CM

## 2017-03-12 MED ORDER — SUVOREXANT 10 MG PO TABS: 10.0000 mg | ORAL_TABLET | Freq: Every day | ORAL | 3 refills | Status: DC

## 2017-03-12 MED ORDER — AMLODIPINE BESYLATE 10 MG PO TABS: 10.0000 mg | ORAL_TABLET | Freq: Every day | ORAL | 3 refills | Status: DC

## 2017-03-12 NOTE — Progress Notes (Signed)
Subjective:    Patient ID: Kendra Michael, female    DOB: [birth year], 51 y.o.   MRN: [id number]  Pt presents to the office today for chronic follow up. Pt is scheduled at the Pain Clinic in May. Pt complaining of increased insomnia. States she is having nightmares and can not sleep longer than 2 hours at one time. Pt states has been told she snores and wakes up "short of breath and weak".  Hypertension  This is a chronic problem. The current episode started more than 1 year ago. The problem has been waxing and waning since onset. The problem is uncontrolled. Associated symptoms include anxiety, malaise/fatigue, peripheral edema (at times) and shortness of breath. Past treatments include calcium channel blockers. The current treatment provides mild improvement. Identifiable causes of hypertension include a thyroid problem.  Constipation  This is a chronic problem. The current episode started more than 1 year ago. Her stool frequency is 1 time per day. She has tried laxatives for the symptoms. The treatment provided significant relief.  Thyroid Problem  Presents for follow-up visit. Symptoms include anxiety, constipation, depressed mood, hoarse voice and weight gain. The symptoms have been worsening. Her past medical history is significant for hyperlipidemia.  Hyperlipidemia  This is a chronic problem. The current episode started more than 1 year ago. The problem is controlled. Exacerbating diseases include obesity. Associated symptoms include shortness of breath. Current antihyperlipidemic treatment includes statins.  Insomnia  Primary symptoms: fragmented sleep, sleep disturbance, frequent awakening, premature morning awakening, malaise/fatigue.  The current episode started more than one year. The onset quality is gradual. The problem occurs nightly. The problem has been rapidly worsening since onset.  Anxiety  Presents for follow-up visit. Symptoms include decreased concentration, depressed mood,  excessive worry, insomnia, irritability, nervous/anxious behavior and shortness of breath. Symptoms occur constantly.    COPD  Pt currently Symbicort BID. Pt currently smoking 1/2 pack a day and is trying to "cut back".    Review of Systems  Constitutional: Positive for irritability, malaise/fatigue and weight gain.  HENT: Positive for hoarse voice.   Respiratory: Positive for shortness of breath.   Gastrointestinal: Positive for constipation.  Psychiatric/Behavioral: Positive for decreased concentration and sleep disturbance. The patient is nervous/anxious and has insomnia.   All other systems reviewed and are negative.      Objective:   Physical Exam  Constitutional: She is oriented to person, place, and time. She appears well-developed and well-nourished. No distress.  HENT:  Head: Normocephalic and atraumatic.  Right Ear: External ear normal.  Mouth/Throat: Oropharynx is clear and moist.  Eyes: Pupils are equal, round, and reactive to light.  Neck: Normal range of motion. Neck supple. No thyromegaly present.  Cardiovascular: Normal rate, regular rhythm, normal heart sounds and intact distal pulses.   No murmur heard. Pulmonary/Chest: Effort normal and breath sounds normal. No respiratory distress. She has no wheezes.  Abdominal: Soft. Bowel sounds are normal. She exhibits no distension. There is no tenderness.  Musculoskeletal: Normal range of motion. She exhibits no edema or tenderness.  Neurological: She is alert and oriented to person, place, and time. She has normal reflexes. No cranial nerve deficit.  Skin: Skin is warm and dry.  Psychiatric: Her behavior is normal. Judgment and thought content normal. Her mood appears anxious.  Pt tearful  Vitals reviewed.    BP (!) 150/84   Pulse 73   Temp 97.7 F (36.5 C) (Oral)   Ht 5' 4"  (1.626 m)   Wt 159 lb  12.8 oz (72.5 kg)   SpO2 99%   BMI 27.43 kg/m      Assessment & Plan:  1. Essential hypertension -Norvasc  increased to 10 mg from 5 mg -Dash diet information given -Exercise encouraged - Stress Management  -Continue current meds -RTO in 2 week s - amLODipine (NORVASC) 10 MG tablet; Take 1 tablet (10 mg total) by mouth daily.  Dispense: 90 tablet; Refill: 3 - CMP14+EGFR  2. COPD exacerbation ([place]) - CMP14+EGFR  3. Constipation, unspecified constipation type - CMP14+EGFR  4. Hypothyroidism, unspecified type - CMP14+EGFR - Thyroid Panel With TSH  5. Tobacco dependence - CMP14+EGFR  6. Insomnia, unspecified type -Pt started on Belsomra 10 mg today Sleep ritual discussed - Suvorexant (BELSOMRA) 10 MG TABS; Take 10 mg by mouth at bedtime.  Dispense: 30 tablet; Refill: 3 - CMP14+EGFR  7. GAD (generalized anxiety disorder) - CMP14+EGFR  8. Hyperlipidemia, unspecified hyperlipidemia type - CMP14+EGFR - Lipid panel  9. Snoring - Ambulatory referral to Neurology - CMP14+EGFR  10. Fatigue, unspecified type - Ambulatory referral to Neurology - CMP14+EGFR   Continue all meds Labs pending Health Maintenance reviewed Diet and exercise encouraged RTO 2 weeks to recheck HTN  Evelina Dun, FNP

## 2017-03-12 NOTE — Patient Instructions (Signed)
Insomnia Insomnia is a sleep disorder that makes it difficult to fall asleep or to stay asleep. Insomnia can cause tiredness (fatigue), low energy, difficulty concentrating, mood swings, and poor performance at work or school. There are three different ways to classify insomnia:  Difficulty falling asleep.  Difficulty staying asleep.  Waking up too early in the morning. Any type of insomnia can be long-term (chronic) or short-term (acute). Both are common. Short-term insomnia usually lasts for three months or less. Chronic insomnia occurs at least three times a week for longer than three months. What are the causes? Insomnia may be caused by another condition, situation, or substance, such as:  Anxiety.  Certain medicines.  Gastroesophageal reflux disease (GERD) or other gastrointestinal conditions.  Asthma or other breathing conditions.  Restless legs syndrome, sleep apnea, or other sleep disorders.  Chronic pain.  Menopause. This may include hot flashes.  Stroke.  Abuse of alcohol, tobacco, or illegal drugs.  Depression.  Caffeine.  Neurological disorders, such as Alzheimer disease.  An overactive thyroid (hyperthyroidism). The cause of insomnia may not be known. What increases the risk? Risk factors for insomnia include:  Gender. Women are more commonly affected than men.  Age. Insomnia is more common as you get older.  Stress. This may involve your professional or personal life.  Income. Insomnia is more common in people with lower income.  Lack of exercise.  Irregular work schedule or night shifts.  Traveling between different time zones. What are the signs or symptoms? If you have insomnia, trouble falling asleep or trouble staying asleep is the main symptom. This may lead to other symptoms, such as:  Feeling fatigued.  Feeling nervous about going to sleep.  Not feeling rested in the morning.  Having trouble concentrating.  Feeling irritable,  anxious, or depressed. How is this treated? Treatment for insomnia depends on the cause. If your insomnia is caused by an underlying condition, treatment will focus on addressing the condition. Treatment may also include:  Medicines to help you sleep.  Counseling or therapy.  Lifestyle adjustments. Follow these instructions at home:  Take medicines only as directed by your health care provider.  Keep regular sleeping and waking hours. Avoid naps.  Keep a sleep diary to help you and your health care provider figure out what could be causing your insomnia. Include:  When you sleep.  When you wake up during the night.  How well you sleep.  How rested you feel the next day.  Any side effects of medicines you are taking.  What you eat and drink.  Make your bedroom a comfortable place where it is easy to fall asleep:  Put up shades or special blackout curtains to block light from outside.  Use a white noise machine to block noise.  Keep the temperature cool.  Exercise regularly as directed by your health care provider. Avoid exercising right before bedtime.  Use relaxation techniques to manage stress. Ask your health care provider to suggest some techniques that may work well for you. These may include:  Breathing exercises.  Routines to release muscle tension.  Visualizing peaceful scenes.  Cut back on alcohol, caffeinated beverages, and cigarettes, especially close to bedtime. These can disrupt your sleep.  Do not overeat or eat spicy foods right before bedtime. This can lead to digestive discomfort that can make it hard for you to sleep.  Limit screen use before bedtime. This includes:  Watching TV.  Using your smartphone, tablet, and computer.  Stick to a   routine. This can help you fall asleep faster. Try to do a quiet activity, brush your teeth, and go to bed at the same time each night.  Get out of bed if you are still awake after 15 minutes of trying to  sleep. Keep the lights down, but try reading or doing a quiet activity. When you feel sleepy, go back to bed.  Make sure that you drive carefully. Avoid driving if you feel very sleepy.  Keep all follow-up appointments as directed by your health care provider. This is important. Contact a health care provider if:  You are tired throughout the day or have trouble in your daily routine due to sleepiness.  You continue to have sleep problems or your sleep problems get worse. Get help right away if:  You have serious thoughts about hurting yourself or someone else. This information is not intended to replace advice given to you by your health care provider. Make sure you discuss any questions you have with your health care provider. Document Released: 10/31/2000 Document Revised: 04/04/2016 Document Reviewed: 08/04/2014 Elsevier Interactive Patient Education  2017 [employer]  

## 2017-03-13 LAB — CMP14+EGFR
A/G RATIO: 1.7 (ref 1.2–2.2)
ALT: 16 IU/L (ref 0–32)
AST: 24 IU/L (ref 0–40)
Albumin: 4.5 g/dL (ref 3.5–5.5)
Alkaline Phosphatase: 102 IU/L (ref 39–117)
BILIRUBIN TOTAL: 0.2 mg/dL (ref 0.0–1.2)
BUN / CREAT RATIO: 14 (ref 9–23)
BUN: 10 mg/dL (ref 6–24)
CHLORIDE: 98 mmol/L (ref 96–106)
CO2: 25 mmol/L (ref 18–29)
Calcium: 9.7 mg/dL (ref 8.7–10.2)
Creatinine, Ser: 0.74 mg/dL (ref 0.57–1.00)
GFR calc non Af Amer: 93 mL/min/{1.73_m2} (ref >59)
GFR, EST AFRICAN AMERICAN: 108 mL/min/{1.73_m2} (ref >59)
GLUCOSE: 85 mg/dL (ref 65–99)
Globulin, Total: 2.7 g/dL (ref 1.5–4.5)
Potassium: 4.2 mmol/L (ref 3.5–5.2)
Sodium: 139 mmol/L (ref 134–144)
TOTAL PROTEIN: 7.2 g/dL (ref 6.0–8.5)

## 2017-03-13 LAB — LIPID PANEL
Chol/HDL Ratio: 3.3 ratio (ref 0.0–4.4)
Cholesterol, Total: 186 mg/dL (ref 100–199)
HDL: 56 mg/dL (ref >39)
LDL Calculated: 104 mg/dL — ABNORMAL HIGH (ref 0–99)
Triglycerides: 128 mg/dL (ref 0–149)
VLDL CHOLESTEROL CAL: 26 mg/dL (ref 5–40)

## 2017-03-13 LAB — THYROID PANEL WITH TSH
FREE THYROXINE INDEX: 2.2 (ref 1.2–4.9)
T3 Uptake Ratio: 26 % (ref 24–39)
T4, Total: 8.5 ug/dL (ref 4.5–12.0)
TSH: 4.19 u[IU]/mL (ref 0.450–4.500)

## 2017-03-16 NOTE — Telephone Encounter (Signed)
x

## 2017-03-24 ENCOUNTER — Telehealth: Payer: Self-pay

## 2017-03-25 MED ORDER — TRAZODONE HCL 50 MG PO TABS: 50.0000 mg | ORAL_TABLET | Freq: Every evening | ORAL | 3 refills | Status: DC | PRN

## 2017-03-25 NOTE — Telephone Encounter (Signed)
Patient aware.

## 2017-03-25 NOTE — Telephone Encounter (Signed)
Belsomra not covered. Trazodone Prescription sent to pharmacy

## 2017-03-26 ENCOUNTER — Ambulatory Visit (INDEPENDENT_AMBULATORY_CARE_PROVIDER_SITE_OTHER): Payer: Medicaid Other | Admitting: Family

## 2017-03-26 ENCOUNTER — Encounter: Payer: Self-pay | Admitting: Family

## 2017-03-26 VITALS — BP 125/75 | HR 92 | Temp 98.2°F | Ht 64.0 in | Wt 162.0 lb

## 2017-03-26 DIAGNOSIS — I10 Essential (primary) hypertension: Secondary | ICD-10-CM | POA: Diagnosis not present

## 2017-03-26 NOTE — Patient Instructions (Signed)

## 2017-03-26 NOTE — Progress Notes (Signed)
   Subjective:    Patient ID: Kendra Michael, female    DOB: 11-10-1966, 51 y.o.   MRN: [id number]  PT presents to the office today for to recheck HTN. PT's BP is at goal! Hypertension  This is a chronic problem. The current episode started more than 1 year ago. The problem has been resolved since onset. The problem is controlled. Associated symptoms include shortness of breath. Pertinent negatives include no malaise/fatigue or peripheral edema. Past treatments include calcium channel blockers. The current treatment provides moderate improvement.      Review of Systems  Constitutional: Negative for malaise/fatigue.  Respiratory: Positive for shortness of breath.   All other systems reviewed and are negative.      Objective:   Physical Exam  Constitutional: She is oriented to person, place, and time. She appears well-developed and well-nourished. No distress.  HENT:  Head: Normocephalic.  Eyes: Pupils are equal, round, and reactive to light.  Neck: Normal range of motion. Neck supple. No thyromegaly present.  Cardiovascular: Normal rate, regular rhythm, normal heart sounds and intact distal pulses.   No murmur heard. Pulmonary/Chest: Effort normal and breath sounds normal. No respiratory distress. She has no wheezes.  Abdominal: Soft. Bowel sounds are normal. She exhibits no distension. There is no tenderness.  Musculoskeletal: Normal range of motion. She exhibits no edema or tenderness.  Neurological: She is alert and oriented to person, place, and time.  Skin: Skin is warm and dry.  Psychiatric: She has a normal mood and affect. Her behavior is normal. Judgment and thought content normal.  Vitals reviewed.     BP 125/75   Pulse 92   Temp 98.2 F (36.8 C) (Oral)   Ht '5\' 4"'$  (1.626 m)   Wt 162 lb (73.5 kg)   BMI 27.81 kg/m      Assessment & Plan:  1. Essential hypertension -Dash diet information given -Exercise encouraged - Stress Management  -Continue current  meds -RTO in 6 months  - BMP8+EGFR   Evelina Dun, FNP

## 2017-03-27 LAB — BMP8+EGFR
BUN / CREAT RATIO: 10 (ref 9–23)
BUN: 8 mg/dL (ref 6–24)
CALCIUM: 9.8 mg/dL (ref 8.7–10.2)
CO2: 23 mmol/L (ref 18–29)
Chloride: 100 mmol/L (ref 96–106)
Creatinine, Ser: 0.79 mg/dL (ref 0.57–1.00)
GFR, EST AFRICAN AMERICAN: 100 mL/min/{1.73_m2} (ref >59)
GFR, EST NON AFRICAN AMERICAN: 86 mL/min/{1.73_m2} (ref >59)
Glucose: 125 mg/dL — ABNORMAL HIGH (ref 65–99)
POTASSIUM: 3.6 mmol/L (ref 3.5–5.2)
Sodium: 141 mmol/L (ref 134–144)

## 2017-04-15 ENCOUNTER — Other Ambulatory Visit: Payer: Self-pay | Admitting: Family

## 2017-04-15 DIAGNOSIS — K5900 Constipation, unspecified: Secondary | ICD-10-CM

## 2017-04-17 ENCOUNTER — Other Ambulatory Visit: Payer: Self-pay | Admitting: Family

## 2017-04-21 ENCOUNTER — Encounter: Payer: Self-pay | Admitting: Neurology

## 2017-04-21 ENCOUNTER — Ambulatory Visit (INDEPENDENT_AMBULATORY_CARE_PROVIDER_SITE_OTHER): Payer: Medicaid Other | Admitting: Neurology

## 2017-04-21 VITALS — BP 163/87 | HR 92 | Ht 64.0 in | Wt 164.0 lb

## 2017-04-21 DIAGNOSIS — F1190 Opioid use, unspecified, uncomplicated: Secondary | ICD-10-CM

## 2017-04-21 DIAGNOSIS — G2581 Restless legs syndrome: Secondary | ICD-10-CM

## 2017-04-21 DIAGNOSIS — R351 Nocturia: Secondary | ICD-10-CM

## 2017-04-21 DIAGNOSIS — R0683 Snoring: Secondary | ICD-10-CM | POA: Diagnosis not present

## 2017-04-21 DIAGNOSIS — G4734 Idiopathic sleep related nonobstructive alveolar hypoventilation: Secondary | ICD-10-CM

## 2017-04-21 DIAGNOSIS — R51 Headache: Secondary | ICD-10-CM

## 2017-04-21 DIAGNOSIS — J449 Chronic obstructive pulmonary disease, unspecified: Secondary | ICD-10-CM

## 2017-04-21 DIAGNOSIS — R519 Headache, unspecified: Secondary | ICD-10-CM

## 2017-04-21 DIAGNOSIS — F17200 Nicotine dependence, unspecified, uncomplicated: Secondary | ICD-10-CM

## 2017-04-21 DIAGNOSIS — G4761 Periodic limb movement disorder: Secondary | ICD-10-CM

## 2017-04-21 NOTE — Progress Notes (Signed)
Subjective:    Patient ID: Kendra Michael is a 51 y.o. female.  HPI     Kendra Age, MD, PhD [facility] [address], [address] P.O. [place], [place] [address]  Dear Kendra Michael,   I saw your patient, Kendra Michael, upon your kind request, in my neurologic clinic today for initial consultation of her sleep disorder, in particular, concern for underlying obstructive sleep apnea. The patient is unaccompanied today. As you know, Kendra Michael is a 51 year old right-handed woman with an underlying medical history of hypertension, COPD, chronic constipation, hypothyroidism, chronic back pain on narcotic pain medication, followed by PMR in [place] and overweight state, who reports snoring and excessive daytime somnolence, desaturations in her sleep as noted during a recent ER visit some 6 weeks ago. She reports that she went to the emergency room because of shortness of breath. ER records are not available for my review today. She reports that she had breathing treatments there and that she was noted to have oxygen desaturations.  Of note, she takes oxycodone IR and ER, has recently started seeing the [facility]. I reviewed your office note from 03/12/2017. She has woken up with a sense of gasping and has had SOB with minimal exertion. She has not seen a lung doctor. She has a history of snoring. She currently lives alone. She is divorced for many years. She has 4 grown children and 5 grandchildren. She has 2 small dogs at the house. She has difficulty falling asleep and staying asleep and is currently on trazodone, has been prescribed Belsomra.  She smokes about a third of a pack per day, quit drinking alcohol 13 years ago, does not use illicit drugs, reports that she quit 13 years ago and drinks caffeine in the form of soda 2-3 glasses per day she reports recent weight gain. In the past 6-8 months she has gained over 20 pounds. At time is around 9 PM, usual wake up time  is 7 to 7:30 PM. She has nocturia at least twice per average night, she has had morning headaches. She endorses restless leg symptoms and the need to rub her feet against each other and she has been told that she moves her legs and feet in her sleep. Her Epworth sleepiness score is 7 out of 24, fatigue score is 43 out of 63.  Her Past Medical History Is Significant For: Past Medical History:  Diagnosis Date  . Anemia    During pregnancy  . Anxiety   . Arthritis   . Cancer ([place])    cervical  . COPD (chronic obstructive pulmonary disease) ([place])   . Emphysema lung ([place])   . Fibromyalgia   . GERD (gastroesophageal reflux disease)   . Hyperlipidemia   . Hypertension   . Substance abuse   . Thyroid disease     Her Past Surgical History Is Significant For: Past Surgical History:  Procedure Laterality Date  . ABDOMINAL HYSTERECTOMY    . APPENDECTOMY    . CHOLECYSTECTOMY    . COLONOSCOPY WITH PROPOFOL N/A 08/08/2016   Procedure: COLONOSCOPY WITH PROPOFOL;  Surgeon: Kendra Houston, MD;  Location: [facility];  Service: Endoscopy;  Laterality: N/A;  930  . HAMMER TOE SURGERY Bilateral   . KNEE SURGERY Bilateral   . SHOULDER SURGERY Right     Her Family History Is Significant For: Family History  Problem Relation Michael of Onset  . Stroke Mother   . Heart attack Father   .  Heart attack Brother     Her Social History Is Significant For: Social History   Social History  . Marital status: Divorced    Spouse name: N/A  . Number of children: N/A  . Years of education: N/A   Social History Main Topics  . Smoking status: Current Every Day Smoker    Packs/day: 1.00    Years: 30.00    Types: Cigarettes  . Smokeless tobacco: Never Used  . Alcohol use No  . Drug use: No  . Sexual activity: Not Asked   Other Topics Concern  . None   Social History Narrative  . None    Her Allergies Are:  Allergies  Allergen Reactions  . Lisinopril Swelling  :   Her Current Medications  Are:  Outpatient Encounter Prescriptions as of 04/21/2017  Medication Sig  . amLODipine (NORVASC) 10 MG tablet Take 1 tablet (10 mg total) by mouth daily.  Marland Kitchen aspirin 81 MG tablet Take 81 mg by mouth daily.  . cyclobenzaprine (FLEXERIL) 10 MG tablet Take 10 mg by mouth.  . DULoxetine (CYMBALTA) 60 MG capsule TAKE 1 CAPSULE (60 MG TOTAL) BY MOUTH DAILY. *MUST BE SEEN BEFORE NEXT REFILL BY MD*  . escitalopram (LEXAPRO) 20 MG tablet TAKE 1 TABLET BY MOUTH EVERY DAY  . fluorometholone (FML) 0.1 % ophthalmic suspension Place 1 drop into both eyes 3 (three) times daily.  . fluticasone (FLONASE) 50 MCG/ACT nasal spray Place 2 sprays into both nostrils daily.  Marland Kitchen levothyroxine (SYNTHROID, LEVOTHROID) 50 MCG tablet TAKE 1 TABLET (50 MCG TOTAL) BY MOUTH DAILY.  Marland Kitchen LINZESS 290 MCG CAPS capsule TAKE 1 CAPSULE BY MOUTH DAILY BEFORE BREAKFAST  . meloxicam (MOBIC) 15 MG tablet Take 15 mg by mouth daily as needed for pain.   . Omega-3 Fatty Acids (FISH OIL) 1000 MG CAPS Take 1 capsule by mouth daily.   . OxyCODONE ER 13.5 MG C12A Take 13.5 mg by mouth every 12 (twelve) hours.  Marland Kitchen oxyCODONE-acetaminophen (PERCOCET) 10-325 MG per tablet Take 1 tablet by mouth 3 (three) times daily.  . pravastatin (PRAVACHOL) 80 MG tablet TAKE 1 TABLET (80 MG TOTAL) BY MOUTH DAILY. *PT NEEDS TO FOLLOW UP AND LAB WORK  . SYMBICORT 160-4.5 MCG/ACT inhaler INHALE 2 PUFFS INTO THE LUNGS 2 TIMES DAILY.  . traZODone (DESYREL) 50 MG tablet Take 1-2 tablets (50-100 mg total) by mouth at bedtime as needed for sleep.   No facility-administered encounter medications on file as of 04/21/2017.   :  Review of Systems:  Out of a complete 14 point review of systems, all are reviewed and negative with the exception of these symptoms as listed below:  Review of Systems  Neurological:       Pt presents today to discuss her sleep. Pt has been hospitalized at [facility] recently and was told her oxygen dropped at night. Pt is reluctant to come  to the lab for a sleep study. Pt does endorse snoring. Pt has never had a formal sleep evaluation. Pt also complains of insomnia.  Epworth Sleepiness Scale 0= would never doze 1= slight chance of dozing 2= moderate chance of dozing 3= high chance of dozing  Sitting and reading: 2 Watching TV: 2 Sitting inactive in a public place (ex. Theater or meeting): 1 As a passenger in a car for an hour without a break: 0 Lying down to rest in the afternoon: 1 Sitting and talking to someone: 0 Sitting quietly after lunch (no alcohol): 1 In a car,  while stopped in traffic: 0 Total: 7     Objective:  Neurologic Exam  Physical Exam Physical Examination:   Vitals:   04/21/17 1557  BP: (!) 163/87  Pulse: 92   General Examination: The patient is a very pleasant 51 y.o. female in no acute distress. She appears well-developed and well-nourished and adequately groomed.   HEENT: Normocephalic, atraumatic, pupils are equal, round and reactive to light and accommodation. Funduscopic exam is normal with sharp disc margins noted. Extraocular tracking is good without limitation to gaze excursion or nystagmus noted. Normal smooth pursuit is noted. Hearing is grossly intact. Tympanic membranes are clear bilaterally. Face is symmetric with normal facial animation and normal facial sensation. Speech is clear with no dysarthria noted. There is no hypophonia. There is no lip, neck/head, jaw or voice tremor. Neck is supple with full range of passive and active motion. There are no carotid bruits on auscultation. Oropharynx exam reveals: moderate mouth dryness, adequate dental hygiene and mild airway crowding, due to thicker tongue and redundant soft palate. Mallampati is class II. Tongue protrudes centrally and palate elevates symmetrically. Tonsils are absent. Neck size is 15 inches. She has a Mild overbite.   Chest: Clear to auscultation without wheezing, rhonchi or crackles noted.  Heart: S1+S2+0, regular and  normal without murmurs, rubs or gallops noted.   Abdomen: Soft, non-tender and non-distended with normal bowel sounds appreciated on auscultation.  Extremities: There is no pitting edema in the distal lower extremities bilaterally. Pedal pulses are intact.  Skin: Warm and dry without trophic changes noted.  Musculoskeletal: exam reveals no obvious joint deformities, tenderness or joint swelling or erythema.   Neurologically:  Mental status: The patient is awake, alert and oriented in all 4 spheres. Her immediate and remote memory, attention, language skills and fund of knowledge are appropriate. There is no evidence of aphasia, agnosia, apraxia or anomia. Speech is clear with normal prosody and enunciation. Thought process is linear. Mood is normal and affect is blunted.  Cranial nerves II - XII are as described above under HEENT exam. In addition: shoulder shrug is normal with equal shoulder height noted. Motor exam: Normal bulk, strength and tone is noted. There is no drift, tremor or rebound. Romberg is negative. Reflexes are 2+ throughout. Babinski: Toes are flexor bilaterally. Fine motor skills and coordination: intact with normal finger taps, normal hand movements, normal rapid alternating patting, normal foot taps and normal foot agility.  Cerebellar testing: No dysmetria or intention tremor on finger to nose testing. Heel to shin is unremarkable bilaterally. There is no truncal or gait ataxia.  Sensory exam: intact to light touch, vibration, temperature sense in the upper and lower extremities.  Gait, station and balance: She stands with mild ddifficulty. No veering to one side is noted. No leaning to one side is noted. Posture is Michael-appropriate and stance is narrow based. Gait shows normal stride length but slightly slowly, she turns cautiously. Tandem walk is slow but possible.   Assessment and Plan:  In summary, Julie Nay is a very pleasant 51 y.o.-year old female with an  underlying medical history of hypertension, COPD, chronic constipation, hypothyroidism, chronic back pain on narcotic pain medication, followed by PMR in [place] and overweight state, whose history and physical exam are concerning for obstructive sleep apnea (OSA). Of note, given her history of smoking, history of COPD and chronic narcotic pain medication use she is at risk for severe desaturations during sleep. She is made aware of this. An attended  sleep study is indicated for this. I had a long chat with the patient about my findings and the diagnosis of OSA, its prognosis and treatment options. We talked about medical treatments, surgical interventions and non-pharmacological approaches. I explained in particular the risks and ramifications of untreated moderate to severe OSA, especially with respect to developing cardiovascular disease down the Road, including congestive heart failure, difficult to treat hypertension, cardiac arrhythmias, or stroke. Even type 2 diabetes has, in part, been linked to untreated OSA. Symptoms of untreated OSA include daytime sleepiness, memory problems, mood irritability and mood disorder such as depression and anxiety, lack of energy, as well as recurrent headaches, especially morning headaches. We talked about smoking cessation and trying to maintain a healthy lifestyle in general, as well as the importance of weight control. I encouraged the patient to eat healthy, exercise daily and keep well hydrated, to keep a scheduled bedtime and wake time routine, to not skip any meals and eat healthy snacks in between meals. I advised the patient not to drive when feeling sleepy. I recommended the following at this time: sleep study with potential positive airway pressure titration. (We will score hypopneas at 4%).   I explained the sleep test procedure to the patient and also outlined possible surgical and non-surgical treatment options of OSA, including the use of a custom-made  dental device (which would require a referral to a specialist dentist or oral surgeon), upper airway surgical options, such as pillar implants, radiofrequency surgery, tongue base surgery, and UPPP (which would involve a referral to an ENT surgeon). Rarely, jaw surgery such as mandibular advancement may be considered.  I also explained the CPAP treatment option to the patient, who indicated that she would be willing to try CPAP if the need arises. I explained the importance of being compliant with PAP treatment, not only for insurance purposes but primarily to improve Her symptoms, and for the patient's long term health benefit, including to reduce Her cardiovascular risks. I answered all her questions today and the patient was in agreement. I would like to see her back after the sleep study is completed and encouraged her to call with any interim questions, concerns, problems or updates.   Thank you very much for allowing me to participate in the care of this nice patient. If I can be of any further assistance to you please do not hesitate to call me at [phone].  Sincerely,   Kendra Age, MD, PhD

## 2017-04-21 NOTE — Patient Instructions (Addendum)
Based on your symptoms and your exam I believe you are at risk for obstructive sleep apnea or OSA, and I think we should proceed with a sleep study to determine whether you do or do not have OSA and how severe it is. If you have more than mild OSA, I want you to consider treatment with CPAP. Please remember, the risks and ramifications of moderate to severe obstructive sleep apnea or OSA are: Cardiovascular disease, including congestive heart failure, stroke, difficult to control hypertension, arrhythmias, and even type 2 diabetes has been linked to untreated OSA. Sleep apnea causes disruption of sleep and sleep deprivation in most cases, which, in turn, can cause recurrent headaches, problems with memory, mood, concentration, focus, and vigilance. Most people with untreated sleep apnea report excessive daytime sleepiness, which can affect their ability to drive. Please do not drive if you feel sleepy.   I will likely see you back after your sleep study to go over the test results and where to go from there. We will call you after your sleep study to advise about the results (most likely, you will hear from Beverlee Nims, my nurse) and to set up an appointment at the time, as necessary.    Our sleep lab administrative assistant, Arrie Aran will meet with you or call you to schedule your sleep study. If you don't hear back from her by next week please feel free to call her at [phone]. This is her direct line and please leave a message with your phone number to call back if you get the voicemail box. She will call back as soon as possible.   Talk to Evelina Dun about seeing a lung doctor for your history of COPD and about smoking cessation.

## 2017-04-28 ENCOUNTER — Telehealth: Payer: Self-pay

## 2017-05-04 ENCOUNTER — Ambulatory Visit (INDEPENDENT_AMBULATORY_CARE_PROVIDER_SITE_OTHER): Payer: Medicaid Other | Admitting: Neurology

## 2017-05-04 DIAGNOSIS — G4720 Circadian rhythm sleep disorder, unspecified type: Secondary | ICD-10-CM

## 2017-05-04 DIAGNOSIS — G4761 Periodic limb movement disorder: Secondary | ICD-10-CM

## 2017-05-04 DIAGNOSIS — R0683 Snoring: Secondary | ICD-10-CM

## 2017-05-05 NOTE — Telephone Encounter (Signed)
x

## 2017-05-07 NOTE — Procedures (Signed)
PATIENT'S NAME:  Kendra, Michael DOB:      29-Dec-1965      MR#:    [id number]     DATE OF RECORDING: 05/04/2017 REFERRING M.D.:  Sharion Balloon FNP Study Performed:   Baseline Polysomnogram HISTORY: 51 year old woman with a history of hypertension, COPD, chronic constipation, hypothyroidism, chronic back pain on narcotic pain medication, and overweight state, who reports snoring and excessive daytime somnolence, desaturations in her sleep as noted during a recent ER visit. The patient endorsed the Epworth Sleepiness Scale at 7/24 points. The patient's weight 164 pounds with a height of 64 (inches), resulting in a BMI of 27.9 kg/m2. The patient's neck circumference measured 15 inches.  CURRENT MEDICATIONS: Norvasc, Flexeril, Cymbalta, Lexapro, FML, Flonase, Synthroid, Linzess, Mobic, Fish Oil, Oxycodone, Pravachol, Symbicort, Desyrel.   PROCEDURE:  This is a multichannel digital polysomnogram utilizing the Somnostar 11.2 system.  Electrodes and sensors were applied and monitored per AASM Specifications.   EEG, EOG, Chin and Limb EMG, were sampled at 200 Hz.  ECG, Snore and Nasal Pressure, Thermal Airflow, Respiratory Effort, CPAP Flow and Pressure, Oximetry was sampled at 50 Hz. Digital video and audio were recorded.      BASELINE STUDY  Lights Out was at 21:08 and Lights On at 04:46.  Total recording time (TRT) was 459 minutes, with a total sleep time (TST) of  421 minutes.   The patient's sleep latency was 43.5 minutes, which is delayed. REM latency was 284.5 minutes, which is markedly delayed. The sleep efficiency was 91.7 %.     SLEEP ARCHITECTURE: WASO (Wake after sleep onset) was 2 minutes.  There were 2.5 minutes in Stage N1, 32.5 minutes Stage N2, 350.5 minutes Stage N3 and 35.5 minutes in Stage REM.  The percentage of Stage N1 was .6%, Stage N2 was 7.7%, which is markedly reduced, Stage N3 was 83.3%, which is markedly increased, and Stage R (REM sleep) was 8.4%, which is reduced.  The arousals  were noted as: 7 were spontaneous, 10 were associated with PLMs, 7 were associated with respiratory events.    Audio and video analysis did not show any abnormal or unusual movements, behaviors, phonations or vocalizations. She slept in the left lateral position only. The patient took no bathroom breaks. Mild to moderate snoring was noted.  EKG was in keeping with normal sinus rhythm (NSR).  RESPIRATORY ANALYSIS:  There were a total of 13 respiratory events:  9 obstructive apneas, 4 central apneas and 0 mixed apneas with a total of 13 apneas and an apnea index (AI) of 1.9 /hour. There were 0 hypopneas with a hypopnea index of 0 /hour. The patient also had 0 respiratory event related arousals (RERAs).      The total APNEA/HYPOPNEA INDEX (AHI) was 1.9/hour and the total RESPIRATORY DISTURBANCE INDEX was 1.9 /hour.  0 events occurred in REM sleep and 4 events in NREM. The REM AHI was 0/hour, versus a non-REM AHI of 2.. The patient spent 0.5 minutes of total sleep time in the supine position and 421 minutes in non-supine.. The supine AHI was n/a versus a non-supine AHI of 1.9.  OXYGEN SATURATION & C02:  The Wake baseline 02 saturation was 97%, with the lowest being 88%. Time spent below 89% saturation equaled 13 minutes.  PERIODIC LIMB MOVEMENTS: The patient had a total of 125 Periodic Limb Movements.  The Periodic Limb Movement (PLM) index was 17.8 and the PLM Arousal index was 1.4/hour.  Post-study, the patient indicated that sleep was better  than usual.   IMPRESSION:  1. Periodic Limb Movement Disorder (PLMD) 2. Primary Snoring 3. Dysfunctions associated with sleep stages or arousal from sleep  RECOMMENDATIONS:  1. This study does not demonstrate any significant obstructive or central sleep disordered breathing, with the exception of mild to moderate snoring and milder desaturations. For disturbing snoring, an oral appliance (through a qualified dentist) can be considered.  2. The use of  narcotic pain medication should be limited or eliminated if possible, as this can cause respiratory suppression and desaturations. Smoking cessation is recommended, optimal COPD management is recommended.  3. Mild PLMs (periodic limb movements of sleep) were noted during this study with no significant arousals; clinical correlation is recommended. Medication effect from the antidepressant medication should be considered.  4. This study shows abnormal sleep stage percentages; these are nonspecific findings and per se do not signify an intrinsic sleep disorder or a cause for the patient's sleep-related symptoms. Causes include (but are not limited to) the first night effect of the sleep study, circadian rhythm disturbances, medication effect (most likely in this patient due to several neuro/psychotropic medications) or an underlying mood disorder or medical problem.  5. The patient should be cautioned not to drive, work at heights, or operate dangerous or heavy equipment when tired or sleepy. Review and reiteration of good sleep hygiene measures should be pursued with any patient. 6. The patient will be seen in follow-up by Dr. Rexene Alberts at [facility] for discussion of the test results and further management strategies. The referring provider will be notified of the test results.  I certify that I have reviewed the entire raw data recording prior to the issuance of this report in accordance with the Standards of Accreditation of the American Academy of Sleep Medicine (AASM)   Star Age, MD, PhD Diplomat, American Board of Psychiatry and Neurology (Neurology and Sleep Medicine)

## 2017-05-07 NOTE — Progress Notes (Signed)
Patient referred by Ms. Hawks, NP, seen by me on 04/21/17, diagnostic PSG on 05/04/17.   Please call and notify the patient that the recent sleep study did not show any significant obstructive sleep apnea. Snoring was noted, mild PLMs. Milder desaturations, for which she is advised to stop smoking and if possible, reduce or possibly eliminate narcotic pain medications (also had excessive slow wave/deep sleep). She is encouraged to talk to her pain management provider. For disturbing snoring, an oral appliance (through a qualified dentist) can be considered.  Please inform patient that I would like to go over the details of the study during a follow up appointment. Arrange a followup appointment. Also, route or fax report to PCP and referring MD, if other than PCP.  Once you have spoken to patient, you can close this encounter.   Thanks,  Star Age, MD, PhD [facility])

## 2017-05-11 ENCOUNTER — Telehealth: Payer: Self-pay

## 2017-05-11 NOTE — Telephone Encounter (Signed)
-----   Message from Star Age, MD sent at 05/07/2017  8:39 AM EDT ----- Patient referred by Ms. Hawks, NP, seen by me on 04/21/17, diagnostic PSG on 05/04/17.   Please call and notify the patient that the recent sleep study did not show any significant obstructive sleep apnea. Snoring was noted, mild PLMs. Milder desaturations, for which she is advised to stop smoking and if possible, reduce or possibly eliminate narcotic pain medications (also had excessive slow wave/deep sleep). She is encouraged to talk to her pain management provider. For disturbing snoring, an oral appliance (through a qualified dentist) can be considered.  Please inform patient that I would like to go over the details of the study during a follow up appointment. Arrange a followup appointment. Also, route or fax report to PCP and referring MD, if other than PCP.  Once you have spoken to patient, you can close this encounter.   Thanks,  Star Age, MD, PhD [facility])

## 2017-05-11 NOTE — Telephone Encounter (Signed)
I called pt. I advised her that Dr. Rexene Alberts reviewed her sleep study and found that pt did not show any significant obstructive sleep apnea. Snoring was noted, and so were mild PLMs. I advised pt that mild desaturations were seen, therefore, I advised pt to stop smoking and reduce her narcotic pain medications if possible. Pt says that her pain management provider has already reduced her medications and she cannot come off of any more pain medications. I advised her that if she wants treatment for her snoring, an oral appliance made by a qualified dentist can be considered. I advised pt that Dr. Rexene Alberts would like to go over the details of this study in a follow up appt. An appt was made with the pt for 07/09/2017 at 11:30 am with Dr. Rexene Alberts. Pt asked that a copy of her sleep study be sent to Evelina Dun, NP. Pt verbalized understanding of results. Pt had no questions at this time but was encouraged to call back if questions arise.

## 2017-06-08 ENCOUNTER — Other Ambulatory Visit: Payer: Self-pay | Admitting: Family

## 2017-06-16 ENCOUNTER — Other Ambulatory Visit: Payer: Self-pay | Admitting: Family

## 2017-06-18 ENCOUNTER — Other Ambulatory Visit: Payer: Self-pay | Admitting: Family

## 2017-06-19 NOTE — Telephone Encounter (Signed)
Last seen 03/26/17  Christy  I do not see this med on list   If approved route to nurse to call into [facility]  [phone]

## 2017-06-25 ENCOUNTER — Ambulatory Visit (INDEPENDENT_AMBULATORY_CARE_PROVIDER_SITE_OTHER): Payer: Medicaid Other | Admitting: Family Medicine

## 2017-06-25 ENCOUNTER — Encounter: Payer: Self-pay | Admitting: Family Medicine

## 2017-06-25 VITALS — BP 140/83 | HR 80 | Temp 99.2°F | Ht 64.0 in | Wt 161.0 lb

## 2017-06-25 DIAGNOSIS — M722 Plantar fascial fibromatosis: Secondary | ICD-10-CM

## 2017-06-25 DIAGNOSIS — G5603 Carpal tunnel syndrome, bilateral upper limbs: Secondary | ICD-10-CM

## 2017-06-25 DIAGNOSIS — R5383 Other fatigue: Secondary | ICD-10-CM

## 2017-06-25 MED ORDER — PREDNISONE 20 MG PO TABS: ORAL_TABLET | ORAL | 0 refills | Status: DC

## 2017-06-25 NOTE — Progress Notes (Signed)
BP 140/83   Pulse 80   Temp 99.2 F (37.3 C) (Oral)   Ht 5\' 4"  (1.626 m)   Wt 161 lb (73 kg)   BMI 27.64 kg/m    Subjective:    Patient ID: Kendra Michael, female    DOB: 02-18-1966, 52 y.o.   MRN: [id number]  HPI: Kendra Michael is a 51 y.o. female presenting on 06/25/2017 for Foot Swelling (last 4 days have been worse); Tingling (hands); and Fatigue   HPI Feet swelling and pain Patient comes in today complaining of swelling and feet pain. She says the swelling is in her ankles and the pain is on the sole of her foot in front of her calcaneus. She has been having this pain over the past 4 days. She denies doing anything specific but does admit that she works in a place where she is standing on concrete most of the time. She denies any fevers or chills or redness or warmth. She denies any numbness or weakness. The pain is worse when she is on her feet or ambulating.  Wrist and hand tingling and numbness Patient says also over the past 4 days she has been having more tingling and numbness in her hands. She does not know anything that necessarily brought this on although she does work as a [employer] manager so she may have been pushing items through. She denies any fevers or chills. She does say she is losing some grip strength in that hand.  Fatigue Patient says she's been increasingly fatigued and tired over the past while and has been gradual. She said that she did get a sleep study and has had some blood work done recently with her primary care Dr. Sherre Scarlet but was concerned because it has not improved. She was told of the sleep study that her oxygen did drop and that she needed to stop her pain medication otherwise she was going to keep stopping breathing while she is sleeping.  Relevant past medical, surgical, family and social history reviewed and updated as indicated. Interim medical history since our last visit reviewed. Allergies and medications reviewed and updated.  Review of  Systems  Constitutional: Positive for fatigue. Negative for chills and fever.  Eyes: Negative for visual disturbance.  Respiratory: Negative for chest tightness and shortness of breath.   Cardiovascular: Negative for chest pain.  Musculoskeletal: Positive for arthralgias and joint swelling. Negative for back pain and gait problem.  Skin: Negative for rash.  Neurological: Positive for weakness and numbness. Negative for dizziness, light-headedness and headaches.  Psychiatric/Behavioral: Negative for agitation and behavioral problems.  All other systems reviewed and are negative.   Per HPI unless specifically indicated above        Objective:    BP 140/83   Pulse 80   Temp 99.2 F (37.3 C) (Oral)   Ht 5\' 4"  (1.626 m)   Wt 161 lb (73 kg)   BMI 27.64 kg/m   Wt Readings from Last 3 Encounters:  06/25/17 161 lb (73 kg)  04/21/17 164 lb (74.4 kg)  03/26/17 162 lb (73.5 kg)    Physical Exam  Constitutional: She is oriented to person, place, and time. She appears well-developed and well-nourished. No distress.  Eyes: Conjunctivae are normal.  Neck: Neck supple. No thyromegaly present.  Cardiovascular: Normal rate, regular rhythm, normal heart sounds and intact distal pulses.   No murmur heard. Pulmonary/Chest: Effort normal and breath sounds normal. No respiratory distress. She has no wheezes. She has no  rales.  Musculoskeletal: Normal range of motion. She exhibits no edema or tenderness.       Feet:  Bilateral carpal tunnel and positive Tinel sign, worse on right than left  Lymphadenopathy:    She has no cervical adenopathy.  Neurological: She is alert and oriented to person, place, and time. Coordination normal.  Skin: Skin is warm and dry. No rash noted. She is not diaphoretic.  Psychiatric: She has a normal mood and affect. Her behavior is normal.  Nursing note and vitals reviewed.       Assessment & Plan:   Problem List Items Addressed This Visit    None      Visit Diagnoses    Plantar fasciitis, bilateral    -  Primary   Recommended ice water bottle and good inserts for her shoes. 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 Has artery had sleep studies and then told this. Recommended for her to go back to sleep Dr.       Follow up plan: Return if symptoms worsen or fail to improve.  Counseling provided for all of the vaccine components No orders of the defined types were placed in this encounter.   Caryl Pina, MD [facility] 06/25/2017, 6:17 PM

## 2017-07-01 ENCOUNTER — Other Ambulatory Visit: Payer: Self-pay | Admitting: Family

## 2017-07-01 NOTE — Telephone Encounter (Signed)
Last seen 06/25/17  Dr Warrick Parisian  Alyse Low PCP  If approved route to nurse to call into [facility]  [phone]

## 2017-07-02 ENCOUNTER — Other Ambulatory Visit: Payer: Self-pay | Admitting: Family

## 2017-07-02 NOTE — Telephone Encounter (Signed)
I do not see on med list

## 2017-07-03 ENCOUNTER — Other Ambulatory Visit: Payer: Self-pay | Admitting: *Deleted

## 2017-07-03 NOTE — Telephone Encounter (Signed)
Refill of clonazepam left on voice mail at pharmacy per Ms. Hawks.

## 2017-07-03 NOTE — Telephone Encounter (Signed)
Not on med list. PT is on oxycodone. I do not see where I have prescribed this in the past

## 2017-07-09 ENCOUNTER — Ambulatory Visit (INDEPENDENT_AMBULATORY_CARE_PROVIDER_SITE_OTHER): Payer: Medicaid Other | Admitting: Neurology

## 2017-07-09 ENCOUNTER — Encounter: Payer: Self-pay | Admitting: Neurology

## 2017-07-09 VITALS — BP 149/94 | HR 79 | Ht 64.0 in | Wt 160.0 lb

## 2017-07-09 DIAGNOSIS — F17200 Nicotine dependence, unspecified, uncomplicated: Secondary | ICD-10-CM | POA: Diagnosis not present

## 2017-07-09 DIAGNOSIS — J449 Chronic obstructive pulmonary disease, unspecified: Secondary | ICD-10-CM | POA: Diagnosis not present

## 2017-07-09 DIAGNOSIS — G4734 Idiopathic sleep related nonobstructive alveolar hypoventilation: Secondary | ICD-10-CM | POA: Diagnosis not present

## 2017-07-09 DIAGNOSIS — G4761 Periodic limb movement disorder: Secondary | ICD-10-CM

## 2017-07-09 NOTE — Progress Notes (Signed)
Subjective:    Patient ID: Kendra Michael is a 51 y.o. female.  HPI     Interim history:  Kendra Michael is a 51 year old right-handed woman with an underlying medical history of hypertension, COPD, chronic constipation, hypothyroidism, chronic back pain on narcotic pain medication, followed by PMR in [place] and overweight state, who presents for follow-up consultation after her sleep study. The patient is accompanied by her daughter today. I first met her on 04/21/2014, at which time she reported witnessed oxygen desaturations during sleep while she was in the emergency room for shortness of breath. She was on narcotic pain medication. I suggested we proceed with a sleep study. She had a baseline sleep study on 05/04/2017. I went over her test results with her in detail today. Sleep efficiency was 91.7%, sleep latency 43.5 minutes, REM latency markedly prolonged at 284.5 minutes. She had an increased percentage of slow-wave sleep at 83.3%, REM sleep was reduced at 8.4% and light stage sleep was reduced. Total AHI was 1.9 per hour. Average oxygen saturation was 97%, nadir was 88%, she had mild PLMS with minimal arousals.  Today, 07/09/2017: She reports doing about the same. She reports that her primary care provider recommends that she be on CPAP. We talked about his sleep study results in detail and I showed her the hip program for better visualization as well. She is working on smoking cessation, will be starting Chantix. She had no interim medication changes. She reports sometimes waking up in the middle of the night with a sense of wheezing and shortness of breath. She has not seen a pulmonologist.She does not endorse restless leg symptoms.  The patient's allergies, current medications, family history, past medical history, past social history, past surgical history and problem list were reviewed and updated as appropriate.   Previously (copied from previous notes for reference):   04/21/2017:  (She) reports snoring and excessive daytime somnolence, desaturations in her sleep as noted during a recent ER visit some 6 weeks ago. She reports that she went to the emergency room because of shortness of breath. ER records are not available for my review today. She reports that she had breathing treatments there and that she was noted to have oxygen desaturations.  Of note, she takes oxycodone IR and ER, has recently started seeing the [facility]. I reviewed your office note from 03/12/2017. She has woken up with a sense of gasping and has had SOB with minimal exertion. She has not seen a lung doctor. She has a history of snoring. She currently lives alone. She is divorced for many years. She has 4 grown children and 5 grandchildren. She has 2 small dogs at the house. She has difficulty falling asleep and staying asleep and is currently on trazodone, has been prescribed Belsomra.  She smokes about a third of a pack per day, quit drinking alcohol 13 years ago, does not use illicit drugs, reports that she quit 13 years ago and drinks caffeine in the form of soda 2-3 glasses per day she reports recent weight gain. In the past 6-8 months she has gained over 20 pounds. At time is around 9 PM, usual wake up time is 7 to 7:30 PM. She has nocturia at least twice per average night, she has had morning headaches. She endorses restless leg symptoms and the need to rub her feet against each other and she has been told that she moves her legs and feet in her sleep. Her Epworth sleepiness score is  7 out of 24, fatigue score is 43 out of 63.  Her Past Medical History Is Significant For: Past Medical History:  Diagnosis Date  . Anemia    During pregnancy  . Anxiety   . Arthritis   . Cancer ([place])    cervical  . COPD (chronic obstructive pulmonary disease) ([place])   . Emphysema lung ([place])   . Fibromyalgia   . GERD (gastroesophageal reflux disease)   . Hyperlipidemia   . Hypertension   . Substance abuse   .  Thyroid disease     Her Past Surgical History Is Significant For: Past Surgical History:  Procedure Laterality Date  . ABDOMINAL HYSTERECTOMY    . APPENDECTOMY    . CHOLECYSTECTOMY    . COLONOSCOPY WITH PROPOFOL N/A 08/08/2016   Procedure: COLONOSCOPY WITH PROPOFOL;  Surgeon: Rogene Houston, MD;  Location: [facility];  Service: Endoscopy;  Laterality: N/A;  930  . HAMMER TOE SURGERY Bilateral   . KNEE SURGERY Bilateral   . SHOULDER SURGERY Right     Her Family History Is Significant For: Family History  Problem Relation Age of Onset  . Stroke Mother   . Heart attack Father   . Heart attack Brother     Her Social History Is Significant For: Social History   Social History  . Marital status: Divorced    Spouse name: N/A  . Number of children: N/A  . Years of education: N/A   Social History Main Topics  . Smoking status: Current Every Day Smoker    Packs/day: 1.00    Years: 30.00    Types: Cigarettes  . Smokeless tobacco: Never Used  . Alcohol use No  . Drug use: No  . Sexual activity: Not on file   Other Topics Concern  . Not on file   Social History Narrative  . No narrative on file    Her Allergies Are:  Allergies  Allergen Reactions  . Lisinopril Swelling  :   Her Current Medications Are:  Outpatient Encounter Prescriptions as of 07/09/2017  Medication Sig  . amLODipine (NORVASC) 10 MG tablet Take 1 tablet (10 mg total) by mouth daily.  Marland Kitchen aspirin 81 MG tablet Take 81 mg by mouth daily.  . clonazePAM (KLONOPIN) 1 MG tablet Take 1 mg by mouth daily as needed.  . DULoxetine (CYMBALTA) 60 MG capsule Take 1 capsule (60 mg total) by mouth daily.  Marland Kitchen escitalopram (LEXAPRO) 20 MG tablet TAKE 1 TABLET BY MOUTH EVERY DAY  . fluorometholone (FML) 0.1 % ophthalmic suspension Place 1 drop into both eyes 3 (three) times daily.  . fluticasone (FLONASE) 50 MCG/ACT nasal spray Place 2 sprays into both nostrils daily.  Marland Kitchen levothyroxine (SYNTHROID, LEVOTHROID) 50 MCG  tablet TAKE 1 TABLET (50 MCG TOTAL) BY MOUTH DAILY.  Marland Kitchen LINZESS 290 MCG CAPS capsule TAKE 1 CAPSULE BY MOUTH DAILY BEFORE BREAKFAST  . meloxicam (MOBIC) 15 MG tablet Take 15 mg by mouth daily as needed for pain.   . Omega-3 Fatty Acids (FISH OIL) 1000 MG CAPS Take 1 capsule by mouth daily.   Marland Kitchen oxyCODONE-acetaminophen (PERCOCET) 10-325 MG per tablet Take 1 tablet by mouth 3 (three) times daily.  . pravastatin (PRAVACHOL) 80 MG tablet TAKE 1 TABLET (80 MG TOTAL) BY MOUTH DAILY. *PT NEEDS TO FOLLOW UP AND LAB WORK  . SYMBICORT 160-4.5 MCG/ACT inhaler INHALE 2 PUFFS INTO THE LUNGS 2 TIMES DAILY.  . traZODone (DESYREL) 50 MG tablet Take 1-2 tablets (50-100 mg total) by  mouth at bedtime as needed for sleep.  . [DISCONTINUED] cyclobenzaprine (FLEXERIL) 10 MG tablet Take 10 mg by mouth.  . [DISCONTINUED] predniSONE (DELTASONE) 20 MG tablet 2 po at same time daily for 5 days   No facility-administered encounter medications on file as of 07/09/2017.   :  Review of Systems:  Out of a complete 14 point review of systems, all are reviewed and negative with the exception of these symptoms as listed below: Review of Systems  Neurological:       Pt following up today and after discussing with her family MD they are wanting her to be on CPAP. Pt states that when she wakes up she is still really tired.     Objective:  Neurological Exam  Physical Exam Physical Examination:   Vitals:   07/09/17 1204  BP: (!) 149/94  Pulse: 79    General Examination: The patient is a very pleasant 51 y.o. female in no acute distress. She appears well-developed and well groomed.   HEENT: Normocephalic, atraumatic, pupils are equal, round and reactive to light and accommodation. Extraocular tracking is good without limitation to gaze excursion or nystagmus noted. Normal smooth pursuit is noted. Hearing is grossly intact. Face is symmetric with normal facial animation and normal facial sensation. Speech is clear with no  dysarthria noted. There is no hypophonia. There is no lip, neck/head, jaw or voice tremor. Neck is supple with full range of passive and active motion. Oropharynx exam reveals: moderate mouth dryness, adequate dental hygiene and mild airway crowding. Mallampati is class II. Tongue protrudes centrally and palate elevates symmetrically. Tonsils are absent.   Chest: Clear to auscultation without wheezing, rhonchi or crackles noted.  Heart: S1+S2+0, regular and normal without murmurs, rubs or gallops noted.   Abdomen: Soft, non-tender and non-distended with normal bowel sounds appreciated on auscultation.  Extremities: There is no pitting edema in the distal lower extremities bilaterally. Pedal pulses are intact.  Skin: Warm and dry without trophic changes noted.  Musculoskeletal: exam reveals no obvious joint deformities, tenderness or joint swelling or erythema.   Neurologically:  Mental status: The patient is awake, alert and oriented in all 4 spheres. Her immediate and remote memory, attention, language skills and fund of knowledge are appropriate. There is no evidence of aphasia, agnosia, apraxia or anomia. Speech is clear with normal prosody and enunciation.   Cranial nerves II - XII are as described above under HEENT exam.  Motor exam: Normal bulk, strength and tone is noted. There is no drift, tremor or rebound. Romberg is negative. Fine motor skills and coordination: grossly intact. There is no truncal or gait ataxia.  Sensory exam: intact to light touch in the upper and lower extremities.  Gait, station and balance: She stands with mild ddifficulty. No veering to one side is noted. No leaning to one side is noted. Posture is age-appropriate and stance is narrow based. Gait shows normal stride length but walks a little slowly.    Assessment and Plan:  In summary, Kendra Michael is a very pleasant 51 year old female with an underlying medical history of hypertension, COPD, chronic  constipation, hypothyroidism, chronic back pain on narcotic pain medication, followed by PMR in [place] and overweight state, Presents for follow-up consultation after her recent sleep study. She did not have any obstructive sleep apnea, overall AHI was 1.9 per hour. Average oxygen saturation during wakefulness was 97 but trended lower during sleep, nadir of 88%. Given her history of smoking, her diagnosis of  COPD and her complaint of shortness of breath she is advised to seek consultation with a pulmonologist to see if COPD could be treated and treatment optimized. She is furthermore strongly advised to quit smoking and she is working on this for which she is commended. She does not have any restless leg symptoms and while she did have mild PLMS during her sleep study these were not associated with any significant arousals and she is advised that medication effect from her antidepressant medication is likely the cause for her PLMD. She had significant slow-wave sleep increased during the study, likely also a medication-induced phenomenon. She has been on trazodone for sleep and to take it for the sleep study. She is advised that CPAP therapy is not warranted or indicated at this time. We talked about maintaining good sleep hygiene. She is advised to follow-up with her primary care provider and I will see her back on an as-needed basis. I answered all her questions today and the patient and her daughter were in agreement. I spent 20 minutes in total face-to-face time with the patient, more than 50% of which was spent in counseling and coordination of care, reviewing test results, reviewing medication and discussing or reviewing the diagnosis of PLMD, snoring, COPD, its prognosis and treatment options. Pertinent laboratory and imaging test results that were available during this visit with the patient were reviewed by me and considered in my medical decision making (see chart for details).

## 2017-07-09 NOTE — Patient Instructions (Signed)
Your sleep study did not show any significant obstructive sleep apnea.  You had mild leg twitching in sleep, which did not disturb your sleep very much. May likely be due to medication.   You had more deep sleep, also likely due to medication effect.   Please try to stop smoking. I am glad to hear you are working on it.   You don't need to be on CPAP therapy. You may benefit from seeing a lung specialist/pulmonologist, please talk to [facility] about it. You may not always expand your lung properly and breathe shallow. Your oxygen levels trended lower in your sleep.    Please remember to try to maintain good sleep hygiene, which means: Keep a regular sleep and wake schedule, try not to exercise or have a meal within 2 hours of your bedtime, try to keep your bedroom conducive for sleep, that is, cool and dark, without light distractors such as an illuminated alarm clock, and refrain from watching TV right before sleep or in the middle of the night and do not keep the TV or radio on during the night. Also, try not to use or play on electronic devices at bedtime, such as your cell phone, tablet PC or laptop. If you like to read at bedtime on an electronic device, try to dim the background light as much as possible. Do not eat in the middle of the night.   I will see you back as needed.

## 2017-07-12 ENCOUNTER — Other Ambulatory Visit: Payer: Self-pay | Admitting: Family

## 2017-08-13 ENCOUNTER — Other Ambulatory Visit: Payer: Self-pay | Admitting: Family

## 2017-09-10 ENCOUNTER — Other Ambulatory Visit: Payer: Self-pay | Admitting: Family

## 2017-09-10 DIAGNOSIS — K5900 Constipation, unspecified: Secondary | ICD-10-CM

## 2017-09-29 ENCOUNTER — Ambulatory Visit: Payer: Self-pay | Admitting: Family

## 2017-10-07 ENCOUNTER — Other Ambulatory Visit: Payer: Self-pay | Admitting: Family

## 2017-10-07 NOTE — Telephone Encounter (Signed)
Last seen 07/09/17  If approved route to nurse to call into [facility] [phone]

## 2017-10-10 ENCOUNTER — Other Ambulatory Visit: Payer: Self-pay | Admitting: Family

## 2017-10-12 NOTE — Telephone Encounter (Signed)
Last lipid 03/12/17

## 2017-11-04 ENCOUNTER — Other Ambulatory Visit: Payer: Self-pay | Admitting: Family

## 2017-11-05 NOTE — Telephone Encounter (Signed)
Last seen 06/25/17   Kendra Michael   If approved route to nurse to call into [facility]  [phone]

## 2017-11-14 ENCOUNTER — Other Ambulatory Visit: Payer: Self-pay | Admitting: Family

## 2017-11-14 DIAGNOSIS — K5900 Constipation, unspecified: Secondary | ICD-10-CM

## 2017-12-08 ENCOUNTER — Other Ambulatory Visit: Payer: Self-pay | Admitting: Family

## 2017-12-09 NOTE — Telephone Encounter (Signed)
Last seen 06/25/17   [facility]

## 2017-12-19 ENCOUNTER — Other Ambulatory Visit: Payer: Self-pay | Admitting: Family

## 2017-12-19 DIAGNOSIS — J301 Allergic rhinitis due to pollen: Secondary | ICD-10-CM

## 2018-01-07 ENCOUNTER — Other Ambulatory Visit: Payer: Self-pay | Admitting: Family

## 2018-01-14 ENCOUNTER — Other Ambulatory Visit: Payer: Self-pay | Admitting: Family

## 2018-02-08 ENCOUNTER — Ambulatory Visit: Payer: Medicaid Other | Admitting: Family

## 2018-02-12 ENCOUNTER — Encounter: Payer: Self-pay | Admitting: Family

## 2018-02-12 ENCOUNTER — Ambulatory Visit: Payer: Medicaid Other | Admitting: Family

## 2018-02-12 VITALS — BP 138/78 | HR 71 | Temp 98.1°F | Ht 64.0 in | Wt 167.8 lb

## 2018-02-12 DIAGNOSIS — E785 Hyperlipidemia, unspecified: Secondary | ICD-10-CM | POA: Diagnosis not present

## 2018-02-12 DIAGNOSIS — J441 Chronic obstructive pulmonary disease with (acute) exacerbation: Secondary | ICD-10-CM | POA: Diagnosis not present

## 2018-02-12 DIAGNOSIS — E039 Hypothyroidism, unspecified: Secondary | ICD-10-CM

## 2018-02-12 DIAGNOSIS — I10 Essential (primary) hypertension: Secondary | ICD-10-CM | POA: Diagnosis not present

## 2018-02-12 DIAGNOSIS — F17200 Nicotine dependence, unspecified, uncomplicated: Secondary | ICD-10-CM

## 2018-02-12 DIAGNOSIS — F411 Generalized anxiety disorder: Secondary | ICD-10-CM

## 2018-02-12 DIAGNOSIS — R7309 Other abnormal glucose: Secondary | ICD-10-CM

## 2018-02-12 MED ORDER — BUSPIRONE HCL 15 MG PO TABS: 15.0000 mg | ORAL_TABLET | Freq: Three times a day (TID) | ORAL | 1 refills | Status: AC

## 2018-02-12 NOTE — Progress Notes (Signed)
Subjective:    Patient ID: Kendra Michael, female    DOB: 03-Sep-1966, 52 y.o.   MRN: [id number]  Pt presents to the office today for chronic follow up. Pt states she is moving to [place] in the next month. Pt is followed by Pain Management.every month.  Hypertension  This is a chronic problem. The current episode started more than 1 year ago. The problem has been resolved since onset. The problem is controlled. Associated symptoms include anxiety and malaise/fatigue. Pertinent negatives include no peripheral edema or shortness of breath. Risk factors for coronary artery disease include obesity and sedentary lifestyle. The current treatment provides moderate improvement. There is no history of kidney disease, CAD/MI, CVA or heart failure. Identifiable causes of hypertension include a thyroid problem.  Thyroid Problem  Presents for follow-up visit. Symptoms include anxiety, constipation, depressed mood and fatigue. Patient reports no dry skin. The symptoms have been stable. Her past medical history is significant for hyperlipidemia. There is no history of heart failure.  Anxiety  Presents for follow-up visit. Symptoms include depressed mood, excessive worry, insomnia, irritability, nervous/anxious behavior and restlessness. Patient reports no shortness of breath. Symptoms occur most days. The severity of symptoms is moderate. The quality of sleep is good.    Hyperlipidemia  This is a chronic problem. The current episode started more than 1 year ago. The problem is controlled. Recent lipid tests were reviewed and are normal. Pertinent negatives include no shortness of breath. Current antihyperlipidemic treatment includes statins. The current treatment provides moderate improvement of lipids. Risk factors for coronary artery disease include dyslipidemia and hypertension.  Insomnia  Primary symptoms: difficulty falling asleep, frequent awakening, malaise/fatigue.  The current episode started more than one  year. The problem occurs intermittently. The problem has been waxing and waning since onset.  COPD  PT currently taking Symbicort BID. Continues to smokes a pack a day.     Review of Systems  Constitutional: Positive for fatigue, irritability and malaise/fatigue.  Respiratory: Negative for shortness of breath.   Gastrointestinal: Positive for constipation.  Psychiatric/Behavioral: The patient is nervous/anxious and has insomnia.   All other systems reviewed and are negative.      Objective:   Physical Exam  Constitutional: She is oriented to person, place, and time. She appears well-developed and well-nourished. No distress.  HENT:  Head: Normocephalic and atraumatic.  Right Ear: External ear normal.  Left Ear: External ear normal.  Nose: Nose normal.  Mouth/Throat: Oropharynx is clear and moist.  Eyes: Pupils are equal, round, and reactive to light.  Neck: Normal range of motion. Neck supple. No thyromegaly present.  Cardiovascular: Normal rate, regular rhythm, normal heart sounds and intact distal pulses.  No murmur heard. Pulmonary/Chest: Effort normal and breath sounds normal. No respiratory distress. She has no wheezes.  Abdominal: Soft. Bowel sounds are normal. She exhibits no distension. There is no tenderness.  Musculoskeletal: Normal range of motion. She exhibits no edema or tenderness.  Neurological: She is alert and oriented to person, place, and time.  Skin: Skin is warm and dry.  Psychiatric: She has a normal mood and affect. Her behavior is normal. Judgment and thought content normal.  Vitals reviewed.    BP 138/78   Pulse 71   Temp 98.1 F (36.7 C) (Oral)   Ht 5' 4"  (1.626 m)   Wt 167 lb 12.8 oz (76.1 kg)   BMI 28.80 kg/m      Assessment & Plan:  1. Essential hypertension - CMP14+EGFR  2. Hypothyroidism,  unspecified type - CMP14+EGFR - TSH  3. GAD (generalized anxiety disorder) We will start Buspar 15 mg TID prn  Stress management discussed  -  CMP14+EGFR - busPIRone (BUSPAR) 15 MG tablet; Take 1 tablet (15 mg total) by mouth 3 (three) times daily.  Dispense: 90 tablet; Refill: 1  4. Hyperlipidemia, unspecified hyperlipidemia type - CMP14+EGFR - Lipid panel  5. Tobacco dependence - CMP14+EGFR  6. COPD exacerbation ([place])  - CMP14+EGFR   Continue all meds Labs pending Health Maintenance reviewed Diet and exercise encouraged RTO 6 months   Evelina Dun, FNP

## 2018-02-12 NOTE — Patient Instructions (Signed)

## 2018-02-13 LAB — LIPID PANEL
Chol/HDL Ratio: 3.9 ratio (ref 0.0–4.4)
Cholesterol, Total: 188 mg/dL (ref 100–199)
HDL: 48 mg/dL (ref >39)
LDL CALC: 103 mg/dL — AB (ref 0–99)
Triglycerides: 186 mg/dL — ABNORMAL HIGH (ref 0–149)
VLDL CHOLESTEROL CAL: 37 mg/dL (ref 5–40)

## 2018-02-13 LAB — CMP14+EGFR
ALBUMIN: 4.5 g/dL (ref 3.5–5.5)
ALT: 14 IU/L (ref 0–32)
AST: 17 IU/L (ref 0–40)
Albumin/Globulin Ratio: 1.7 (ref 1.2–2.2)
Alkaline Phosphatase: 117 IU/L (ref 39–117)
BILIRUBIN TOTAL: 0.2 mg/dL (ref 0.0–1.2)
BUN / CREAT RATIO: 8 — AB (ref 9–23)
BUN: 7 mg/dL (ref 6–24)
CHLORIDE: 102 mmol/L (ref 96–106)
CO2: 24 mmol/L (ref 20–29)
Calcium: 10 mg/dL (ref 8.7–10.2)
Creatinine, Ser: 0.84 mg/dL (ref 0.57–1.00)
GFR calc non Af Amer: 80 mL/min/{1.73_m2} (ref >59)
GFR, EST AFRICAN AMERICAN: 92 mL/min/{1.73_m2} (ref >59)
GLOBULIN, TOTAL: 2.6 g/dL (ref 1.5–4.5)
GLUCOSE: 111 mg/dL — AB (ref 65–99)
Potassium: 4.3 mmol/L (ref 3.5–5.2)
SODIUM: 142 mmol/L (ref 134–144)
TOTAL PROTEIN: 7.1 g/dL (ref 6.0–8.5)

## 2018-02-13 LAB — TSH: TSH: 3.35 u[IU]/mL (ref 0.450–4.500)

## 2018-02-15 ENCOUNTER — Other Ambulatory Visit: Payer: Self-pay | Admitting: Family

## 2018-02-16 LAB — BAYER DCA HB A1C WAIVED: HB A1C (BAYER DCA - WAIVED): 5.2 % (ref <7.0)

## 2018-02-16 NOTE — Addendum Note (Signed)
Addended by: Liliane Bade on: 02/16/2018 03:54 PM   Modules accepted: Orders

## 2018-02-16 NOTE — Addendum Note (Signed)
Addended by: Shelbie Ammons on: 02/16/2018 03:43 PM   Modules accepted: Orders

## 2018-02-21 ENCOUNTER — Other Ambulatory Visit: Payer: Self-pay | Admitting: Family

## 2018-03-17 ENCOUNTER — Other Ambulatory Visit: Payer: Self-pay | Admitting: Family

## 2018-03-17 DIAGNOSIS — K5900 Constipation, unspecified: Secondary | ICD-10-CM

## 2018-03-24 ENCOUNTER — Other Ambulatory Visit: Payer: Self-pay | Admitting: Family

## 2018-03-24 DIAGNOSIS — I10 Essential (primary) hypertension: Secondary | ICD-10-CM

## 2018-04-18 ENCOUNTER — Other Ambulatory Visit: Payer: Self-pay | Admitting: Family

## 2018-04-21 ENCOUNTER — Encounter (HOSPITAL_COMMUNITY): Payer: Self-pay | Admitting: Emergency Medicine

## 2018-04-21 ENCOUNTER — Emergency Department (HOSPITAL_COMMUNITY)
Admission: EM | Admit: 2018-04-21 | Discharge: 2018-04-21 | Disposition: A | Discharge status: Home / Self Care | Payer: Medicaid Other | Attending: Emergency Medicine | Admitting: Emergency Medicine

## 2018-04-21 DIAGNOSIS — R221 Localized swelling, mass and lump, neck: Secondary | ICD-10-CM | POA: Insufficient documentation

## 2018-04-21 DIAGNOSIS — Z5321 Procedure and treatment not carried out due to patient leaving prior to being seen by health care provider: Secondary | ICD-10-CM | POA: Insufficient documentation

## 2018-04-21 NOTE — ED Notes (Signed)
Patient reports "I am going to my other hospital I cannot wait to be seen". Left AMA.
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
## 2018-05-11 ENCOUNTER — Other Ambulatory Visit: Payer: Self-pay | Admitting: Family

## 2018-05-11 DIAGNOSIS — J301 Allergic rhinitis due to pollen: Secondary | ICD-10-CM

## 2018-05-11 NOTE — Telephone Encounter (Signed)
OV 02/12/18 fu 15mos

## 2018-05-17 ENCOUNTER — Other Ambulatory Visit: Payer: Self-pay | Admitting: Family

## 2018-06-15 ENCOUNTER — Other Ambulatory Visit: Payer: Self-pay | Admitting: Family Medicine

## 2018-06-16 NOTE — Telephone Encounter (Signed)
02/12/18 OV RTC 6 mos
